# Patient Record
Sex: Female | Born: 1955 | Race: White | Hispanic: No | Marital: Single | Smoking: Never smoker
Health system: Southern US, Community
[De-identification: ages and names within clinical notes are randomized; demographics above are authoritative.]

## PROBLEM LIST (undated history)

## (undated) DIAGNOSIS — R51 Headache: Secondary | ICD-10-CM

## (undated) DIAGNOSIS — Z862 Personal history of diseases of the blood and blood-forming organs and certain disorders involving the immune mechanism: Secondary | ICD-10-CM

## (undated) DIAGNOSIS — K2970 Gastritis, unspecified, without bleeding: Secondary | ICD-10-CM

## (undated) DIAGNOSIS — D259 Leiomyoma of uterus, unspecified: Secondary | ICD-10-CM

## (undated) DIAGNOSIS — M810 Age-related osteoporosis without current pathological fracture: Secondary | ICD-10-CM

## (undated) HISTORY — DX: Personal history of diseases of the blood and blood-forming organs and certain disorders involving the immune mechanism: Z86.2

## (undated) HISTORY — DX: Leiomyoma of uterus, unspecified: D25.9

## (undated) HISTORY — DX: Gastritis, unspecified, without bleeding: K29.70

## (undated) HISTORY — PX: APPENDECTOMY: SHX54

## (undated) HISTORY — DX: Age-related osteoporosis without current pathological fracture: M81.0

## (undated) HISTORY — PX: WISDOM TOOTH EXTRACTION: SHX21

---
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
## 1997-09-11 ENCOUNTER — Other Ambulatory Visit: Admission: RE | Admit: 1997-09-11 | Discharge: 1997-09-11 | Payer: Self-pay | Admitting: *Deleted

## 1998-07-27 ENCOUNTER — Other Ambulatory Visit: Admission: RE | Admit: 1998-07-27 | Discharge: 1998-07-27 | Payer: Self-pay | Admitting: *Deleted

## 1998-07-31 ENCOUNTER — Encounter (INDEPENDENT_AMBULATORY_CARE_PROVIDER_SITE_OTHER): Payer: Self-pay | Admitting: Specialist

## 1998-07-31 ENCOUNTER — Other Ambulatory Visit: Admission: RE | Admit: 1998-07-31 | Discharge: 1998-07-31 | Payer: Self-pay | Admitting: *Deleted

## 1999-06-03 ENCOUNTER — Other Ambulatory Visit: Admission: RE | Admit: 1999-06-03 | Discharge: 1999-06-03 | Payer: Self-pay | Admitting: *Deleted

## 2001-12-04 ENCOUNTER — Other Ambulatory Visit: Admission: RE | Admit: 2001-12-04 | Discharge: 2001-12-04 | Payer: Self-pay | Admitting: Obstetrics and Gynecology

## 2003-04-21 ENCOUNTER — Other Ambulatory Visit: Admission: RE | Admit: 2003-04-21 | Discharge: 2003-04-21 | Payer: Self-pay | Admitting: Obstetrics and Gynecology

## 2004-12-10 ENCOUNTER — Emergency Department (HOSPITAL_COMMUNITY): Admission: EM | Admit: 2004-12-10 | Discharge: 2004-12-10 | Payer: Self-pay | Admitting: Family Medicine

## 2005-09-28 ENCOUNTER — Other Ambulatory Visit: Admission: RE | Admit: 2005-09-28 | Discharge: 2005-09-28 | Payer: Self-pay | Admitting: Obstetrics & Gynecology

## 2005-12-08 ENCOUNTER — Ambulatory Visit (HOSPITAL_COMMUNITY): Admission: RE | Admit: 2005-12-08 | Discharge: 2005-12-08 | Payer: Self-pay | Admitting: Gastroenterology

## 2006-07-02 HISTORY — PX: LAPAROSCOPIC UNILATERAL SALPINGECTOMY: SHX5934

## 2006-07-11 ENCOUNTER — Encounter: Payer: Self-pay | Admitting: Obstetrics & Gynecology

## 2006-07-11 ENCOUNTER — Ambulatory Visit (HOSPITAL_COMMUNITY): Admission: RE | Admit: 2006-07-11 | Discharge: 2006-07-11 | Payer: Self-pay | Admitting: Obstetrics & Gynecology

## 2006-09-15 ENCOUNTER — Other Ambulatory Visit: Admission: RE | Admit: 2006-09-15 | Discharge: 2006-09-15 | Payer: Self-pay | Admitting: Obstetrics & Gynecology

## 2007-09-19 ENCOUNTER — Other Ambulatory Visit: Admission: RE | Admit: 2007-09-19 | Discharge: 2007-09-19 | Payer: Self-pay | Admitting: Obstetrics & Gynecology

## 2010-06-15 NOTE — Op Note (Signed)
NAME:  Rhonda Shaffer, Rhonda Shaffer             ACCOUNT NO.:  [id number]   MEDICAL RECORD NO.:  [id number]          PATIENT TYPE:  AMB   LOCATION:  SDC                           FACILITY:  [facility]   PHYSICIAN:  M. Leda Quail, MD  DATE OF BIRTH:  1955-08-12   DATE OF PROCEDURE:  07/11/2006  DATE OF DISCHARGE:                               OPERATIVE REPORT   PREOPERATIVE DIAGNOSES:  94. 55 year old white female with enlarging right hydrosalpinx.  2. History of endometriosis.  3. History of uterine suspension  4. History of ovarian cancer in her sister.   POSTOPERATIVE DIAGNOSES:  74. 67 year old white female with enlarging right hydrosalpinx.  2. History of endometriosis.  3. History of uterine suspension  4. History of ovarian cancer in her sister.   PROCEDURE:  Laparoscopic left salpingectomy.   SURGEON:  Dr. Hyacinth Meeker   ASSISTANT:  Edwena Felty. Romine, M.D.   ANESTHESIA:  General endotracheal.   SPECIMENS:  Left tube to pathology.   ESTIMATED BLOOD LOSS:  Minimal.   URINE OUTPUT:  50 mL.   FLUIDS:  800 mL of LR.   COMPLICATIONS:  None.   INDICATIONS:  The patient is a very nice 55 year old white female who  has a history of an enlarging left hydrosalpinx. This was initially  discovered on ultrasound.  The patient underwent this because of some  abdominal discomfort and recent diagnosis of ovarian cancer in her  sister.  We have followed this conservatively.  However, this is  enlarging.  We discussed continued monitoring versus surgical excision  and she has opted to go ahead and have removed surgically.  She is here  for this today.  Risks and benefits have explained and informed consent  is present on the chart.   PROCEDURE:  The patient is taken to the operating room.  She is placed  in supine position.  General endotracheal anesthesia is administered by  the anesthesia staff without difficulty.  Abdomen, perineum, inner  thighs and vagina prepped in normal sterile fashion.   Bivalve speculum  was placed in the vagina.  The anterior lip of the cervix was grasped  with single-tooth tenaculum.  Hulka clamp was passed through the  cervical os and attached to the cervix as a means of manipulating the  uterus during the procedure.  The tenaculum was removed and speculum was  removed from vagina.  Foley catheter is placed to straight drain.  The  patient is draped in normal sterile fashion.   Attention is turned to the umbilicus.  5 mL of half percent Marcaine  were instilled beneath umbilicus.  A 10 mm skin incision was made  inferiorly from the belly button.  Subcutaneous fat and tissue were  dissected with a hemostat.  The fascia is identified and grasped with  Kocher clamps.  It is elevated and the fascia is incised sharply with  Mayo scissors.  The S retractors were placed through this incision.  The  peritoneum was identified, elevated, and incised sharply.  S retractors  were placed through this.  A nonbladed Hasson and port were placed  through the incision after  the fascia was stitched on each side with a  single 2-0 Vicryl.  The Vicryls attached to the [place].  The port is  removed.  The laparoscope was placed through the port to ensure  intraperitoneal placement and then the pneumoperitoneum is achieved with  CO2 gas.  With the pneumoperitoneum was achieved the pelvis was  surveyed.  The patient did have some dense adhesions of the fundus of  the uterus to the anterior abdominal wall.  She had a uterine prolapse  that was repaired when she was about 55 years old. My guess is she had a  retroflexed uterus that was attached to the anterior abdominal wall.  The right ovary and fallopian tube well visualized and appear normal.  The left fallopian tube is enlarged and appears to have blood in it and  has dark appearance.  The left ovary looked normal.  There is no actual  evidence of endometriosis.  The patient does have some areas on the  peritoneum  consistent with scarring so I am in agreement with her past  history of endometriosis.  The upper abdomen surveyed as well.  No  abnormalities were noted.  Photo documentation was made.   Attention is then turned to the inferior right and left quadrants.  The  abdominal wall area was transilluminated to note the location of the  vasculature.  Placement sites for the right and left lower quadrant  ports are identified.  These are approximately 2 to 2-1/2 cm above the  pubic symphysis and lateral to the inferior epigastric vessel.  The 5 mm  skin incisions are made with the blade.  Then bladed trocars and ports  are placed under direct visualization of the laparoscope through the  abdominal wall layer.  The trocars are removed.  An endoscopic grasper  and blunt probe were obtained.  The bowel is scooped out of the pelvis  and the patient is placed in Trendelenburg.  The left side is well  visualized and decision was made to obtain a gyrus.  Then using the  gyrus the fallopian tube is cut off of the ovary, the mesosalpinx was  grasped and gyrus closed. Cautery with sound indicator was used and then  the blade is used to dissect the tissue plane between the ovary and the  fallopian tube.  This was carried up all way to the cornua and then the  cornu was transected with a gyrus as well by a clamping, cauterizing and  cutting the tissue.  Once the fallopian tube was freed from the left  ovary, then a grasper was brought through the operative port on the  laparoscope. This hydrosalpinx was grasped and brought through the port  intact.  It was handed off to pathology.  Then a long irrigation tip  with 60 mL of normal saline was attached to it, was passed through the  right lower quadrant port.  The left area of the dissection was  irrigated.  No bleeding was noted.  The irrigant was emptied out of the  pelvis.  At this point the patient was placed back in supine position  and Trendelenburg was  released.  The right and left lower quadrant ports  removed under direct visualization and no bleeding was noted.  The  pneumoperitoneum was released and the laparoscope was removed.   Anesthesia gave the patient several good breaths and the operator's  assistant pressed on the abdomen to help the gas escape from the  abdomen.  The [employer]  was then removed from the midline and the previous  fascial stitches were tied together to bring the fascia together in the  midline.  The infraumbilical incision was then closed with a 4-0 Vicryl  with a subcuticular stitch.  The skin was closed at all three sites with  Dermabond.  The patient's legs were positioned back in supine position  and the Betadine wash was cleansed off the skin.  The patient was  awakened from anesthesia and extubated without difficulty.   Sponge, lap, needle and instrument counts were correct x2.  The patient  was in stable condition and taken to recovery room at this point.      Lum Keas, MD  Electronically Signed     MSM/MEDQ  D:  07/11/2006  T:  07/11/2006  Job:  [id number]

## 2010-06-18 NOTE — Op Note (Signed)
NAME:  Rhonda Shaffer, Rhonda Shaffer             ACCOUNT NO.:  [id number]   MEDICAL RECORD NO.:  [id number]          PATIENT TYPE:  AMB   LOCATION:  ENDO                         FACILITY:  [facility]   PHYSICIAN:  Anselmo Rod, M.D.  DATE OF BIRTH:  Feb 25, 1955   DATE OF PROCEDURE:  12/09/2005  DATE OF DISCHARGE:                                 OPERATIVE REPORT   PROCEDURE PERFORMED:  Screening colonoscopy.   ENDOSCOPIST:  Anselmo Rod, M.D.   INSTRUMENT USED:  Olympus video colonoscope.   INDICATION FOR PROCEDURE:  This 55 year old white female underwent a  screening colonoscopy to rule out colonic polyps, masses, etc.   PREPROCEDURE PREPARATION:  Informed consent was procured from the patient.  The patient was fasted for 4 hours prior to the procedure and prepped with  20 Osmo Prep pills the night of and 12 Osmo Prep pills the morning of the  procedure.  Risks and benefits of the procedure including a 10% miss rate of  cancer and polyp were discussed with the patient as well.   PREPROCEDURE PHYSICAL:  Patient had stable vital signs.  NECK:  Supple.  CHEST:  Clear to auscultation.  S1, S2 regular.  ABDOMEN:  Soft with normal bowel sounds.   DESCRIPTION OF THE PROCEDURE:  The patient was placed in the left lateral  decubitus position, sedated with 75 mcg of fentanyl and 8 mg of Versed given  intravenously in slow incremental doses.  Once the patient was adequately  sedated and maintained on low flow oxygen and continuous cardiac monitoring,  the pediatric adjustable colonoscope was advanced from the rectum to the  cecum.  Multiple washings were done.  The appendiceal orifice and ileocecal  valve were clearly visualized and photographed.  The terminal ileum appeared  healthy and without lesions.  There were no masses, polyps, erosions,  ulceration or diverticula seen.  Retroflexion of the rectum revealed no  abnormalities.  The patient tolerated the procedure well without immediate  complications.   IMPRESSION:  Normal colonoscopy of the terminal ileum.  No masses, polyps or  diverticula seen.   RECOMMENDATIONS:  1. Continue on high fiber diet with liberal fluid intake.  2. Repeat colonoscopy in the next 10 years unless the patient develops any      abnormal symptoms in the interim.  3. Outpatient follow up as need arises in the future.      Anselmo Rod, M.D.  Electronically Signed     JNM/MEDQ  D:  12/09/2005  T:  12/09/2005  Job:  [id number]   cc:   M. Leda Quail, MD

## 2010-11-18 LAB — URINALYSIS, ROUTINE W REFLEX MICROSCOPIC
Glucose, UA: NEGATIVE
Ketones, ur: NEGATIVE
Protein, ur: NEGATIVE

## 2010-11-18 LAB — BASIC METABOLIC PANEL
GFR calc non Af Amer: >60
Glucose, Bld: 92
Potassium: 3.6
Sodium: 140

## 2010-11-18 LAB — CBC
MCHC: 33.5
RBC: 3.92
WBC: 7.3

## 2010-11-18 LAB — URINE MICROSCOPIC-ADD ON

## 2012-06-13 ENCOUNTER — Telehealth: Payer: Self-pay | Admitting: Obstetrics & Gynecology

## 2012-06-13 NOTE — Telephone Encounter (Signed)
Spoke with pt who was trying to make a MMG appt, and wanted a 3D MMG done because she had a family member who has dense breasts tell her to request this type from now on. She was told she needed a doctor's permission to have 3D done. Pt wondering if Dr. Hyacinth Meeker would order this for her.

## 2012-06-13 NOTE — Telephone Encounter (Signed)
I will order it for her.  Can you bring me her chart?

## 2012-06-13 NOTE — Telephone Encounter (Signed)
Patient called for requests from doctor Hyacinth Meeker for 3D screening for breast cancer.

## 2012-06-14 NOTE — Telephone Encounter (Signed)
She can just schedule this.  I double checked with [employer].  She can even do it on-line.

## 2012-06-14 NOTE — Telephone Encounter (Signed)
LM for pt that she can schedule MMG herself. SM checked with [employer]. Pt can schedule online. Pt to call back if she has problems.

## 2012-10-25 ENCOUNTER — Encounter: Payer: Self-pay | Admitting: Obstetrics & Gynecology

## 2013-02-21 ENCOUNTER — Encounter: Payer: Self-pay | Admitting: Obstetrics & Gynecology

## 2013-02-22 ENCOUNTER — Encounter: Payer: Self-pay | Admitting: Obstetrics & Gynecology

## 2013-02-22 ENCOUNTER — Ambulatory Visit (INDEPENDENT_AMBULATORY_CARE_PROVIDER_SITE_OTHER): Payer: BC Managed Care – PPO | Admitting: Obstetrics & Gynecology

## 2013-02-22 VITALS — BP 128/72 | HR 60 | Resp 16 | Ht 62.0 in | Wt 130.6 lb

## 2013-02-22 DIAGNOSIS — Z0000 Encounter for general adult medical examination without abnormal findings: Secondary | ICD-10-CM

## 2013-02-22 DIAGNOSIS — Z8041 Family history of malignant neoplasm of ovary: Secondary | ICD-10-CM

## 2013-02-22 DIAGNOSIS — Z01419 Encounter for gynecological examination (general) (routine) without abnormal findings: Secondary | ICD-10-CM

## 2013-02-22 LAB — POCT URINALYSIS DIPSTICK
BILIRUBIN UA: NEGATIVE
Glucose, UA: NEGATIVE
Ketones, UA: NEGATIVE
Leukocytes, UA: NEGATIVE
NITRITE UA: NEGATIVE
PH UA: 5
Protein, UA: NEGATIVE
RBC UA: NEGATIVE
UROBILINOGEN UA: NEGATIVE

## 2013-02-22 LAB — COMPREHENSIVE METABOLIC PANEL
ALBUMIN: 4.4 g/dL (ref 3.5–5.2)
ALT: 21 U/L (ref 0–35)
AST: 23 U/L (ref 0–37)
Alkaline Phosphatase: 78 U/L (ref 39–117)
BILIRUBIN TOTAL: 0.9 mg/dL (ref 0.3–1.2)
BUN: 15 mg/dL (ref 6–23)
CO2: 32 meq/L (ref 19–32)
Calcium: 9.9 mg/dL (ref 8.4–10.5)
Chloride: 102 mEq/L (ref 96–112)
Creat: 0.67 mg/dL (ref 0.50–1.10)
GLUCOSE: 115 mg/dL — AB (ref 70–99)
POTASSIUM: 4.7 meq/L (ref 3.5–5.3)
SODIUM: 140 meq/L (ref 135–145)
TOTAL PROTEIN: 7.1 g/dL (ref 6.0–8.3)

## 2013-02-22 LAB — TSH: TSH: 1.995 u[IU]/mL (ref 0.350–4.500)

## 2013-02-22 LAB — HEMOGLOBIN, FINGERSTICK: HEMOGLOBIN, FINGERSTICK: 13.6 g/dL (ref 12.0–16.0)

## 2013-02-22 LAB — LIPID PANEL
CHOLESTEROL: 193 mg/dL (ref 0–200)
HDL: 70 mg/dL (ref >39)
LDL Cholesterol: 87 mg/dL (ref 0–99)
Total CHOL/HDL Ratio: 2.8 Ratio
Triglycerides: 181 mg/dL — ABNORMAL HIGH (ref <150)
VLDL: 36 mg/dL (ref 0–40)
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
## 2013-02-22 NOTE — Patient Instructions (Signed)

## 2013-02-23 LAB — VITAMIN D 25 HYDROXY (VIT D DEFICIENCY, FRACTURES): VIT D 25 HYDROXY: 39 ng/mL (ref 30–89)

## 2013-02-23 LAB — CA 125: CA 125: 20.7 U/mL (ref 0.0–30.2)

## 2013-02-26 ENCOUNTER — Telehealth: Payer: Self-pay | Admitting: Obstetrics & Gynecology

## 2013-02-26 NOTE — Telephone Encounter (Signed)
Voicemail confirmed patient identity/Left message for patient to call back to discuss benefits for upcoming surgery/ssf

## 2013-02-27 NOTE — Telephone Encounter (Signed)
I telephoned the patient. At the patients request I left a detailed voicemail message concerning her insurance coverage for the physicians portion of her surgery charges. I advised that patient liability for the physicians charges were quoted as  $1282.49 ($750 ded + $532.49 coins). I explained that part is due to her $750 calendar year deductible not being met and the remaining portion is due to her plan paying at 70% and leaving 30% patient liability. I advised that Gay Filler would be contacting her to schedule the surgery and that once the surgery is scheduled, this patient amount $1282.49, would need to be paid in full at least 2 weeks prior to her surgery date. Left office # for the patient to call if she has further questions and/or concerns.

## 2013-02-27 NOTE — Telephone Encounter (Signed)
Pt is returning a call to [place]. Pt states it is ok for you to leave a detailed message with dates and time.

## 2013-02-28 ENCOUNTER — Telehealth: Payer: Self-pay

## 2013-02-28 NOTE — Telephone Encounter (Signed)
Lmtcb//kn 

## 2013-02-28 NOTE — Telephone Encounter (Signed)
Message copied by Robley Fries on Thu Feb 28, 2013  1:42 PM ------      Message from: Megan Salon      Created: Mon Feb 25, 2013  1:08 PM       Inform ca-125 normal.   Lipids fine--triglycerides are a little up but test wasn't fasting.  Vit D fine.  TSH nl.  CMP with mildly elevated glucose but, again, test wasn't fasting. ------

## 2013-02-28 NOTE — Telephone Encounter (Signed)
Patient notified

## 2013-03-04 ENCOUNTER — Telehealth: Payer: Self-pay | Admitting: Orthopedic Surgery

## 2013-03-04 NOTE — Telephone Encounter (Signed)
LMTCB concerning surgery instructions.

## 2013-04-01 ENCOUNTER — Encounter (HOSPITAL_COMMUNITY): Payer: Self-pay

## 2013-04-01 ENCOUNTER — Encounter (HOSPITAL_COMMUNITY)
Admission: RE | Admit: 2013-04-01 | Discharge: 2013-04-01 | Disposition: A | Discharge status: Home / Self Care | Payer: BC Managed Care – PPO | Source: Ambulatory Visit | Attending: Obstetrics & Gynecology | Admitting: Obstetrics & Gynecology

## 2013-04-01 ENCOUNTER — Telehealth: Payer: Self-pay | Admitting: *Deleted

## 2013-04-01 ENCOUNTER — Ambulatory Visit (INDEPENDENT_AMBULATORY_CARE_PROVIDER_SITE_OTHER): Payer: BC Managed Care – PPO | Admitting: Obstetrics & Gynecology

## 2013-04-01 VITALS — BP 116/78 | HR 68 | Resp 16 | Ht 62.25 in | Wt 135.2 lb

## 2013-04-01 DIAGNOSIS — Z01812 Encounter for preprocedural laboratory examination: Secondary | ICD-10-CM | POA: Insufficient documentation

## 2013-04-01 DIAGNOSIS — N9489 Other specified conditions associated with female genital organs and menstrual cycle: Secondary | ICD-10-CM

## 2013-04-01 DIAGNOSIS — N736 Female pelvic peritoneal adhesions (postinfective): Secondary | ICD-10-CM

## 2013-04-01 HISTORY — DX: Headache: R51

## 2013-04-01 LAB — CBC
HCT: 41 % (ref 36.0–46.0)
Hemoglobin: 13.5 g/dL (ref 12.0–15.0)
MCH: 30.2 pg (ref 26.0–34.0)
MCHC: 32.9 g/dL (ref 30.0–36.0)
MCV: 91.7 fL (ref 78.0–100.0)
PLATELETS: 345 10*3/uL (ref 150–400)
RBC: 4.47 MIL/uL (ref 3.87–5.11)
RDW: 12.9 % (ref 11.5–15.5)
WBC: 8.6 10*3/uL (ref 4.0–10.5)

## 2013-04-01 MED ORDER — OXYCODONE-ACETAMINOPHEN 5-325 MG PO TABS: 2.0000 | ORAL_TABLET | ORAL | Status: DC | PRN

## 2013-04-01 MED ORDER — IBUPROFEN 800 MG PO TABS: 800.0000 mg | ORAL_TABLET | Freq: Three times a day (TID) | ORAL | Status: DC | PRN

## 2013-04-01 NOTE — Progress Notes (Signed)
Pelvic U/S scheduled and patient aware/agreeable to time.  Patient verbalized understanding of the U/S appointment cancellation policy. Advised will need to cancel within 72 business hours (3 business days) or will have $100.00 no show fee placed to account.  Scheduled for 04/11/13 at 1300.

## 2013-04-01 NOTE — Telephone Encounter (Signed)
Done when pt was in my office.  Thanks.
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
## 2013-04-01 NOTE — Patient Instructions (Addendum)
   Your procedure is scheduled on:  Monday, Mar 16  Enter through the [employer] [facility] at: 6 AM Pick up the phone at the desk and dial [phone] and inform us of your arrival.  Please call this number if you have any problems the morning of surgery: [phone]  Remember: Do not eat or drink after midnight: Sunday Take these medicines the morning of surgery with a SIP OF WATER:  None  Do not wear jewelry, make-up, or FINGER nail polish No metal in your hair or on your body. Do not wear lotions, powders, perfumes.  You may wear deodorant.  Do not bring valuables to the hospital. Contacts, dentures or bridgework may not be worn into surgery.  Leave suitcase in the car. After Surgery it may be brought to your room. For patients being admitted to the hospital, checkout time is 11:00am the day of discharge.  Home with cousin [facility] [phone] Cell [phone].

## 2013-04-01 NOTE — Telephone Encounter (Signed)
Patient's pre-op appt is today and they are requesting orders.

## 2013-04-02 MED ORDER — DEXTROSE 5 % IV SOLN: 2.0000 g | Freq: Once | INTRAVENOUS | Status: DC

## 2013-04-03 ENCOUNTER — Encounter: Payer: Self-pay | Admitting: Obstetrics & Gynecology

## 2013-04-11 ENCOUNTER — Ambulatory Visit (INDEPENDENT_AMBULATORY_CARE_PROVIDER_SITE_OTHER): Payer: BC Managed Care – PPO | Admitting: Obstetrics & Gynecology

## 2013-04-11 ENCOUNTER — Encounter: Payer: Self-pay | Admitting: Obstetrics & Gynecology

## 2013-04-11 ENCOUNTER — Ambulatory Visit (INDEPENDENT_AMBULATORY_CARE_PROVIDER_SITE_OTHER): Payer: BC Managed Care – PPO

## 2013-04-11 VITALS — BP 122/72 | Ht 62.25 in | Wt 137.0 lb

## 2013-04-11 DIAGNOSIS — N736 Female pelvic peritoneal adhesions (postinfective): Secondary | ICD-10-CM

## 2013-04-11 DIAGNOSIS — R102 Pelvic and perineal pain: Secondary | ICD-10-CM

## 2013-04-11 DIAGNOSIS — G8929 Other chronic pain: Secondary | ICD-10-CM

## 2013-04-11 DIAGNOSIS — N9489 Other specified conditions associated with female genital organs and menstrual cycle: Secondary | ICD-10-CM

## 2013-04-11 DIAGNOSIS — Z8041 Family history of malignant neoplasm of ovary: Secondary | ICD-10-CM

## 2013-04-11 DIAGNOSIS — N949 Unspecified condition associated with female genital organs and menstrual cycle: Secondary | ICD-10-CM

## 2013-04-11 NOTE — Progress Notes (Signed)
58 y.o. G0 Singlefemale here for a pelvic ultrasound.  Having hysterectomy and pt here to assess ovaries before surgery due to family hx of ovarian cancer in her sister.  Pt has a few more questions about surgery that were addressed today.  Patient's last menstrual period was 01/31/2010.  Sexually active:  yes  Contraception: PMP  FINDINGS: UTERUS: 6.5 x 4.0 x 2.5cm without fibroids EMS: 4.18mm ADNEXA:   Left ovary 1.6 x 0.8 x 0.8cm, no masses   Right ovary 2.2 x 0.9 x 1.1cm CUL DE SAC: no free fluid  Images reviewed.  All questions answered.  Pt getting a little nervous but ready to proceed  Assessment:  Chronic pelvic pain, family hx of ovarian cancer Plan: Proceed with surgery as planned.  ~15 minutes spent with patient >50% of time was in face to face discussion of above.

## 2013-04-14 NOTE — Anesthesia Preprocedure Evaluation (Addendum)
Anesthesia Evaluation  Patient identified by MRN, date of birth, ID band Patient awake    Reviewed: Allergy & Precautions, H&P , NPO status , Patient's Chart, lab work & pertinent test results  Airway Mallampati: II TM Distance: >3 FB Neck ROM: Full    Dental   Pulmonary neg pulmonary ROS,  breath sounds clear to auscultation        Cardiovascular negative cardio ROS  Rhythm:Regular Rate:Normal     Neuro/Psych  Headaches, negative psych ROS   GI/Hepatic negative GI ROS, Neg liver ROS,   Endo/Other  negative endocrine ROS  Renal/GU negative Renal ROS     Musculoskeletal negative musculoskeletal ROS (+)   Abdominal   Peds  Hematology  (+) anemia ,   Anesthesia Other Findings   Reproductive/Obstetrics negative OB ROS                           Anesthesia Physical Anesthesia Plan  ASA: II  Anesthesia Plan: General   Post-op Pain Management:    Induction: Intravenous  Airway Management Planned: Oral ETT  Additional Equipment:   Intra-op Plan:   Post-operative Plan: Extubation in OR  Informed Consent: I have reviewed the patients History and Physical, chart, labs and discussed the procedure including the risks, benefits and alternatives for the proposed anesthesia with the patient or authorized representative who has indicated his/her understanding and acceptance.   Dental advisory given  Plan Discussed with: CRNA  Anesthesia Plan Comments:         Anesthesia Quick Evaluation

## 2013-04-15 ENCOUNTER — Ambulatory Visit (HOSPITAL_COMMUNITY): Payer: BC Managed Care – PPO | Admitting: Anesthesiology

## 2013-04-15 ENCOUNTER — Ambulatory Visit (HOSPITAL_COMMUNITY)
Admission: RE | Admit: 2013-04-15 | Discharge: 2013-04-15 | Disposition: A | Discharge status: Home / Self Care | Payer: BC Managed Care – PPO | Source: Ambulatory Visit | Attending: Obstetrics & Gynecology | Admitting: Obstetrics & Gynecology

## 2013-04-15 ENCOUNTER — Encounter (HOSPITAL_COMMUNITY): Payer: Self-pay | Admitting: *Deleted

## 2013-04-15 ENCOUNTER — Encounter (HOSPITAL_COMMUNITY)
Admission: RE | Disposition: A | Discharge status: Home / Self Care | Payer: Self-pay | Source: Ambulatory Visit | Attending: Obstetrics & Gynecology

## 2013-04-15 ENCOUNTER — Encounter (HOSPITAL_COMMUNITY): Payer: BC Managed Care – PPO | Admitting: Anesthesiology

## 2013-04-15 DIAGNOSIS — R102 Pelvic and perineal pain: Secondary | ICD-10-CM | POA: Diagnosis present

## 2013-04-15 DIAGNOSIS — G8929 Other chronic pain: Secondary | ICD-10-CM | POA: Insufficient documentation

## 2013-04-15 DIAGNOSIS — N949 Unspecified condition associated with female genital organs and menstrual cycle: Secondary | ICD-10-CM | POA: Insufficient documentation

## 2013-04-15 DIAGNOSIS — N9489 Other specified conditions associated with female genital organs and menstrual cycle: Secondary | ICD-10-CM

## 2013-04-15 DIAGNOSIS — N736 Female pelvic peritoneal adhesions (postinfective): Secondary | ICD-10-CM

## 2013-04-15 DIAGNOSIS — Z8041 Family history of malignant neoplasm of ovary: Secondary | ICD-10-CM | POA: Insufficient documentation

## 2013-04-15 DIAGNOSIS — D649 Anemia, unspecified: Secondary | ICD-10-CM | POA: Insufficient documentation

## 2013-04-15 HISTORY — PX: CYSTOSCOPY: SHX5120

## 2013-04-15 HISTORY — PX: ROBOTIC ASSISTED TOTAL HYSTERECTOMY WITH BILATERAL SALPINGO OOPHERECTOMY: SHX6086

## 2013-04-15 LAB — HEMOGLOBIN: Hemoglobin: 12.6 g/dL (ref 12.0–15.0)

## 2013-04-15 SURGERY — ROBOTIC ASSISTED TOTAL HYSTERECTOMY WITH BILATERAL SALPINGO OOPHORECTOMY
Anesthesia: General | Site: Urethra

## 2013-04-15 MED ORDER — OXYCODONE-ACETAMINOPHEN 5-325 MG PO TABS
1.0000 | ORAL_TABLET | ORAL | Status: DC | PRN
  Administered 2013-04-15: 1 via ORAL
  Filled 2013-04-15: qty 1

## 2013-04-15 MED ORDER — DEXTROSE 5 % IV SOLN
2.0000 g | Freq: Once | INTRAVENOUS | Status: AC
  Administered 2013-04-15: 2 g via INTRAVENOUS
  Filled 2013-04-15: qty 2

## 2013-04-15 MED ORDER — ROCURONIUM BROMIDE 100 MG/10ML IV SOLN
INTRAVENOUS | Status: AC
  Filled 2013-04-15: qty 1

## 2013-04-15 MED ORDER — MEPERIDINE HCL 25 MG/ML IJ SOLN: 6.2500 mg | INTRAMUSCULAR | Status: DC | PRN

## 2013-04-15 MED ORDER — MORPHINE SULFATE 4 MG/ML IJ SOLN: 1.0000 mg | INTRAMUSCULAR | Status: DC | PRN

## 2013-04-15 MED ORDER — GLYCOPYRROLATE 0.2 MG/ML IJ SOLN
INTRAMUSCULAR | Status: AC
  Filled 2013-04-15: qty 1

## 2013-04-15 MED ORDER — ROCURONIUM BROMIDE 100 MG/10ML IV SOLN
INTRAVENOUS | Status: DC | PRN
  Administered 2013-04-15: 50 mg via INTRAVENOUS
  Administered 2013-04-15: 20 mg via INTRAVENOUS

## 2013-04-15 MED ORDER — MENTHOL 3 MG MT LOZG: 1.0000 | LOZENGE | OROMUCOSAL | Status: DC | PRN

## 2013-04-15 MED ORDER — LIDOCAINE HCL (CARDIAC) 20 MG/ML IV SOLN
INTRAVENOUS | Status: DC | PRN
  Administered 2013-04-15: 70 mg via INTRAVENOUS

## 2013-04-15 MED ORDER — DEXTROSE-NACL 5-0.45 % IV SOLN
INTRAVENOUS | Status: DC
  Administered 2013-04-15: 13:00:00 via INTRAVENOUS

## 2013-04-15 MED ORDER — SODIUM CHLORIDE 0.9 % IJ SOLN
INTRAMUSCULAR | Status: AC
  Filled 2013-04-15: qty 10

## 2013-04-15 MED ORDER — FENTANYL CITRATE 0.05 MG/ML IJ SOLN
INTRAMUSCULAR | Status: DC | PRN
  Administered 2013-04-15: 100 ug via INTRAVENOUS
  Administered 2013-04-15 (×3): 50 ug via INTRAVENOUS

## 2013-04-15 MED ORDER — OXYCODONE-ACETAMINOPHEN 5-325 MG PO TABS: 2.0000 | ORAL_TABLET | ORAL | Status: DC | PRN

## 2013-04-15 MED ORDER — KETOROLAC TROMETHAMINE 30 MG/ML IJ SOLN
30.0000 mg | Freq: Four times a day (QID) | INTRAMUSCULAR | Status: DC
  Administered 2013-04-15: 30 mg via INTRAVENOUS
  Filled 2013-04-15: qty 1

## 2013-04-15 MED ORDER — STERILE WATER FOR IRRIGATION IR SOLN
Status: DC | PRN
  Administered 2013-04-15: 1000 mL via INTRAVESICAL

## 2013-04-15 MED ORDER — HYDROMORPHONE HCL PF 1 MG/ML IJ SOLN
INTRAMUSCULAR | Status: DC | PRN
  Administered 2013-04-15: 1 mg via INTRAVENOUS

## 2013-04-15 MED ORDER — ALUM & MAG HYDROXIDE-SIMETH 200-200-20 MG/5ML PO SUSP: 30.0000 mL | ORAL | Status: DC | PRN

## 2013-04-15 MED ORDER — KETOROLAC TROMETHAMINE 30 MG/ML IJ SOLN: 30.0000 mg | Freq: Four times a day (QID) | INTRAMUSCULAR | Status: DC

## 2013-04-15 MED ORDER — ROPIVACAINE HCL 5 MG/ML IJ SOLN
INTRAMUSCULAR | Status: AC
  Filled 2013-04-15: qty 60

## 2013-04-15 MED ORDER — ATROPINE SULFATE 0.4 MG/ML IJ SOLN
INTRAMUSCULAR | Status: DC | PRN
  Administered 2013-04-15: 0.4 mg via INTRAVENOUS

## 2013-04-15 MED ORDER — HYDROMORPHONE HCL PF 1 MG/ML IJ SOLN
INTRAMUSCULAR | Status: AC
  Filled 2013-04-15: qty 1

## 2013-04-15 MED ORDER — ATROPINE SULFATE 0.4 MG/ML IJ SOLN
INTRAMUSCULAR | Status: AC
  Filled 2013-04-15: qty 1

## 2013-04-15 MED ORDER — ACETAMINOPHEN 325 MG PO TABS: 650.0000 mg | ORAL_TABLET | ORAL | Status: DC | PRN

## 2013-04-15 MED ORDER — FENTANYL CITRATE 0.05 MG/ML IJ SOLN
INTRAMUSCULAR | Status: AC
  Filled 2013-04-15: qty 5

## 2013-04-15 MED ORDER — LIDOCAINE HCL (CARDIAC) 20 MG/ML IV SOLN
INTRAVENOUS | Status: AC
  Filled 2013-04-15: qty 5

## 2013-04-15 MED ORDER — MIDAZOLAM HCL 2 MG/2ML IJ SOLN
INTRAMUSCULAR | Status: DC | PRN
  Administered 2013-04-15: 2 mg via INTRAVENOUS

## 2013-04-15 MED ORDER — ONDANSETRON HCL 4 MG/2ML IJ SOLN
4.0000 mg | Freq: Once | INTRAMUSCULAR | Status: AC | PRN
  Administered 2013-04-15: 4 mg via INTRAVENOUS
  Filled 2013-04-15: qty 2

## 2013-04-15 MED ORDER — MIDAZOLAM HCL 2 MG/2ML IJ SOLN
INTRAMUSCULAR | Status: AC
  Filled 2013-04-15: qty 2

## 2013-04-15 MED ORDER — PROPOFOL 10 MG/ML IV BOLUS
INTRAVENOUS | Status: DC | PRN
  Administered 2013-04-15: 170 mg via INTRAVENOUS

## 2013-04-15 MED ORDER — LACTATED RINGERS IV SOLN
INTRAVENOUS | Status: DC
  Administered 2013-04-15 (×2): via INTRAVENOUS

## 2013-04-15 MED ORDER — PANTOPRAZOLE SODIUM 40 MG IV SOLR
40.0000 mg | Freq: Every day | INTRAVENOUS | Status: DC
  Filled 2013-04-15: qty 40

## 2013-04-15 MED ORDER — DEXAMETHASONE SODIUM PHOSPHATE 10 MG/ML IJ SOLN
INTRAMUSCULAR | Status: DC | PRN
  Administered 2013-04-15: 10 mg via INTRAVENOUS

## 2013-04-15 MED ORDER — FENTANYL CITRATE 0.05 MG/ML IJ SOLN
25.0000 ug | INTRAMUSCULAR | Status: DC | PRN
  Administered 2013-04-15: 25 ug via INTRAVENOUS

## 2013-04-15 MED ORDER — SIMETHICONE 80 MG PO CHEW: 80.0000 mg | CHEWABLE_TABLET | Freq: Four times a day (QID) | ORAL | Status: DC | PRN

## 2013-04-15 MED ORDER — GLYCOPYRROLATE 0.2 MG/ML IJ SOLN
INTRAMUSCULAR | Status: DC | PRN
  Administered 2013-04-15: .4 mg via INTRAVENOUS
  Administered 2013-04-15: 0.2 mg via INTRAVENOUS

## 2013-04-15 MED ORDER — ONDANSETRON HCL 4 MG/2ML IJ SOLN
INTRAMUSCULAR | Status: AC
  Filled 2013-04-15: qty 2

## 2013-04-15 MED ORDER — PROPOFOL 10 MG/ML IV EMUL
INTRAVENOUS | Status: AC
  Filled 2013-04-15: qty 20

## 2013-04-15 MED ORDER — NEOSTIGMINE METHYLSULFATE 1 MG/ML IJ SOLN
INTRAMUSCULAR | Status: DC | PRN
  Administered 2013-04-15: 3 mg via INTRAVENOUS

## 2013-04-15 MED ORDER — KETOROLAC TROMETHAMINE 30 MG/ML IJ SOLN
INTRAMUSCULAR | Status: DC | PRN
  Administered 2013-04-15: 30 mg via INTRAVENOUS

## 2013-04-15 MED ORDER — NEOSTIGMINE METHYLSULFATE 1 MG/ML IJ SOLN
INTRAMUSCULAR | Status: AC
  Filled 2013-04-15: qty 1

## 2013-04-15 MED ORDER — GLYCOPYRROLATE 0.2 MG/ML IJ SOLN
INTRAMUSCULAR | Status: AC
  Filled 2013-04-15: qty 2

## 2013-04-15 MED ORDER — SODIUM CHLORIDE 0.9 % IJ SOLN
INTRAMUSCULAR | Status: AC
  Filled 2013-04-15: qty 50

## 2013-04-15 MED ORDER — ROPIVACAINE HCL 5 MG/ML IJ SOLN
INTRAMUSCULAR | Status: DC | PRN
  Administered 2013-04-15: 60 mL

## 2013-04-15 MED ORDER — ONDANSETRON HCL 4 MG/2ML IJ SOLN
INTRAMUSCULAR | Status: DC | PRN
  Administered 2013-04-15: 4 mg via INTRAVENOUS

## 2013-04-15 MED ORDER — FENTANYL CITRATE 0.05 MG/ML IJ SOLN
INTRAMUSCULAR | Status: AC
  Filled 2013-04-15: qty 2

## 2013-04-15 MED ORDER — LACTATED RINGERS IR SOLN
Status: DC | PRN
  Administered 2013-04-15: 3000 mL

## 2013-04-15 MED ORDER — DEXAMETHASONE SODIUM PHOSPHATE 10 MG/ML IJ SOLN
INTRAMUSCULAR | Status: AC
  Filled 2013-04-15: qty 1

## 2013-04-15 MED ORDER — KETOROLAC TROMETHAMINE 30 MG/ML IJ SOLN: 15.0000 mg | Freq: Once | INTRAMUSCULAR | Status: DC | PRN

## 2013-04-15 SURGICAL SUPPLY — 66 items
ADH SKN CLS APL DERMABOND .7 (GAUZE/BANDAGES/DRESSINGS) ×2
APL SKNCLS STERI-STRIP NONHPOA (GAUZE/BANDAGES/DRESSINGS) ×2
BAG URINE DRAINAGE (UROLOGICAL SUPPLIES) IMPLANT
BARRIER ADHS 3X4 INTERCEED (GAUZE/BANDAGES/DRESSINGS) ×3 IMPLANT
BENZOIN TINCTURE PRP APPL 2/3 (GAUZE/BANDAGES/DRESSINGS) ×3 IMPLANT
BRR ADH 4X3 ABS CNTRL BYND (GAUZE/BANDAGES/DRESSINGS) ×2
CHLORAPREP W/TINT 26ML (MISCELLANEOUS) ×3 IMPLANT
CLOTH BEACON ORANGE TIMEOUT ST (SAFETY) ×3 IMPLANT
CONT PATH 16OZ SNAP LID 3702 (MISCELLANEOUS) ×3 IMPLANT
COVER MAYO STAND STRL (DRAPES) ×3 IMPLANT
COVER TABLE BACK 60X90 (DRAPES) ×6 IMPLANT
COVER TIP SHEARS 8 DVNC (MISCELLANEOUS) ×2 IMPLANT
COVER TIP SHEARS 8MM DA VINCI (MISCELLANEOUS) ×1
DECANTER SPIKE VIAL GLASS SM (MISCELLANEOUS) ×3 IMPLANT
DERMABOND ADVANCED (GAUZE/BANDAGES/DRESSINGS) ×1
DERMABOND ADVANCED .7 DNX12 (GAUZE/BANDAGES/DRESSINGS) ×2 IMPLANT
DILATOR CANAL MILEX (MISCELLANEOUS) ×3 IMPLANT
DRAPE HUG U DISPOSABLE (DRAPE) ×3 IMPLANT
DRAPE LG THREE QUARTER DISP (DRAPES) ×6 IMPLANT
DRAPE WARM FLUID 44X44 (DRAPE) ×3 IMPLANT
ELECT REM PT RETURN 9FT ADLT (ELECTROSURGICAL) ×3
ELECTRODE REM PT RTRN 9FT ADLT (ELECTROSURGICAL) ×2 IMPLANT
EVACUATOR SMOKE 8.L (FILTER) ×3 IMPLANT
GAUZE VASELINE 3X9 (GAUZE/BANDAGES/DRESSINGS) IMPLANT
GLOVE BIOGEL PI IND STRL 7.0 (GLOVE) ×4 IMPLANT
GLOVE BIOGEL PI INDICATOR 7.0 (GLOVE) ×2
GLOVE ECLIPSE 6.5 STRL STRAW (GLOVE) ×12 IMPLANT
GOWN STRL REIN XL XLG (GOWN DISPOSABLE) ×18 IMPLANT
GYRUS RUMI II 2.5CM BLUE (DISPOSABLE) ×3
KIT ACCESSORY DA VINCI DISP (KITS) ×1
KIT ACCESSORY DVNC DISP (KITS) ×2 IMPLANT
LEGGING LITHOTOMY PAIR STRL (DRAPES) ×3 IMPLANT
NEEDLE INSUFFLATION 120MM (ENDOMECHANICALS) ×6 IMPLANT
OCCLUDER COLPOPNEUMO (BALLOONS) ×3 IMPLANT
PACK LAVH (CUSTOM PROCEDURE TRAY) ×3 IMPLANT
PAD PREP 24X48 CUFFED NSTRL (MISCELLANEOUS) ×6 IMPLANT
PLUG CATH AND CAP STER (CATHETERS) ×3 IMPLANT
PROTECTOR NERVE ULNAR (MISCELLANEOUS) ×6 IMPLANT
RUMI II GYRUS 2.5CM BLUE (DISPOSABLE) ×2 IMPLANT
SET CYSTO W/LG BORE CLAMP LF (SET/KITS/TRAYS/PACK) ×3 IMPLANT
SET IRRIG TUBING LAPAROSCOPIC (IRRIGATION / IRRIGATOR) ×3 IMPLANT
SOLUTION ELECTROLUBE (MISCELLANEOUS) ×3 IMPLANT
STRIP CLOSURE SKIN 1/4X4 (GAUZE/BANDAGES/DRESSINGS) ×3 IMPLANT
SUT VIC AB 0 CT1 27 (SUTURE) ×15
SUT VIC AB 0 CT1 27XBRD ANBCTR (SUTURE) ×10 IMPLANT
SUT VICRYL 0 UR6 27IN ABS (SUTURE) ×3 IMPLANT
SUT VICRYL 3 0 RAPIDE (SUTURE) ×3 IMPLANT
SUT VICRYL RAPIDE 4/0 PS 2 (SUTURE) ×12 IMPLANT
SUT VLOC 180 0 9IN  GS21 (SUTURE) ×2
SUT VLOC 180 0 9IN GS21 (SUTURE) ×4 IMPLANT
SYR 50ML LL SCALE MARK (SYRINGE) ×3 IMPLANT
SYSTEM CONVERTIBLE TROCAR (TROCAR) ×3 IMPLANT
TIP RUMI ORANGE 6.7MMX12CM (TIP) IMPLANT
TIP UTERINE 5.1X6CM LAV DISP (MISCELLANEOUS) ×3 IMPLANT
TIP UTERINE 6.7X10CM GRN DISP (MISCELLANEOUS) IMPLANT
TIP UTERINE 6.7X6CM WHT DISP (MISCELLANEOUS) IMPLANT
TIP UTERINE 6.7X8CM BLUE DISP (MISCELLANEOUS) ×3 IMPLANT
TOWEL OR 17X24 6PK STRL BLUE (TOWEL DISPOSABLE) ×6 IMPLANT
TRAY FOLEY CATH 14FR (SET/KITS/TRAYS/PACK) ×3 IMPLANT
TROCAR DILATING TIP 12MM 150MM (ENDOMECHANICALS) ×3 IMPLANT
TROCAR DISP BLADELESS 8 DVNC (TROCAR) ×2 IMPLANT
TROCAR DISP BLADELESS 8MM (TROCAR) ×1
TROCAR XCEL NON-BLD 5MMX100MML (ENDOMECHANICALS) ×3 IMPLANT
TUBING FILTER THERMOFLATOR (ELECTROSURGICAL) ×3 IMPLANT
WARMER LAPAROSCOPE (MISCELLANEOUS) ×3 IMPLANT
WATER STERILE IRR 1000ML POUR (IV SOLUTION) ×9 IMPLANT

## 2013-04-15 NOTE — Addendum Note (Signed)
Addendum created 04/15/13 1756 by Ignacia Bayley, CRNA   Modules edited: Notes Section   Notes Section:  File: [id number]

## 2013-04-15 NOTE — Anesthesia Postprocedure Evaluation (Signed)
  Anesthesia Post-op Note  Patient: Rhonda Shaffer  Procedure(s) Performed: Procedure(s): ROBOTIC ASSISTED TOTAL HYSTERECTOMY WITH  BILATERAL  OOPHORECTOMY AND  SALPINGECTOMY (Bilateral) CYSTOSCOPY (N/A)  Patient Location: [facility]  Anesthesia Type:General  Level of Consciousness: awake  Airway and Oxygen Therapy: Patient Spontanous Breathing  Post-op Pain: mild  Post-op Assessment: Patient's Cardiovascular Status Stable and Respiratory Function Stable  Post-op Vital Signs: stable  Complications: No apparent anesthesia complications

## 2013-04-15 NOTE — Discharge Instructions (Signed)

## 2013-04-15 NOTE — Progress Notes (Signed)
D/c pt/ a/ox3 denies pain pt. Walked out per her choicewith nt at her side

## 2013-04-15 NOTE — Anesthesia Postprocedure Evaluation (Signed)
Anesthesia Post Note  Patient: Rhonda Shaffer  Procedure(s) Performed: Procedure(s) (LRB): ROBOTIC ASSISTED TOTAL HYSTERECTOMY WITH  BILATERAL  OOPHORECTOMY AND  SALPINGECTOMY (Bilateral) CYSTOSCOPY (N/A)  Anesthesia type: General  Patient location: PACU  Post pain: Pain level controlled  Post assessment: Post-op Vital signs reviewed  Last Vitals:  Filed Vitals:   04/15/13 1041  BP: 123/65  Pulse: 59  Temp: 36.9 C  Resp: 16    Post vital signs: Reviewed  Level of consciousness: sedated  Complications: No apparent anesthesia complications
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
## 2013-04-15 NOTE — Discharge Summary (Signed)
Physician Discharge Summary  Patient ID: Rhonda Shaffer MRN: [id number] DOB/AGE: Apr 07, 1955 58 y.o.  Admit date: 04/15/2013 Discharge date: 04/15/2013  Admission Diagnoses: chronic pelvic pain, family hx of ovarian cancer  Discharge Diagnoses:  Active Problems:   * No active hospital problems. *   Discharged Condition: good  Hospital Course: Patient admitted through same day surgery.  She was taken to OR where robotic TLH/BSO, cystoscopy, LOA were performed.  Surgical findings included adhesions of round ligaments to anterior abdominal wall.  Surgery was uneventful.  EBL 25cc.  Foley catheter was removed before leaving OR.  Patient transferred to PACU where she was stable and then to 3rd floor for the remainder of her hospitalization.  During her post-op recovery, her vitals and stable and she was AF.  In evening of POD#0, she was able to transition to oral pain medications and regular diet.  She was able to ambulate and she had good pain control.  She was also able to void on her own.  Her exam was normal.  Post op hb was 12.6, decreased from 13.5, pre-operatively.  Pt met all criteria for discharge and desired to go home.   Consults: None  Significant Diagnostic Studies: labs: 12.6  Treatments: surgery: robotic TLH/BSO, cystoscopy  Discharge Exam: Blood pressure 108/57, pulse 71, temperature 98.5 F (36.9 C), temperature source Oral, resp. rate 18, height 5' 2"  (1.575 m), weight 131 lb (59.421 kg), last menstrual period 01/31/2010, SpO2 96.00%. General appearance: alert and cooperative Resp: clear to auscultation bilaterally Cardio: regular rate and rhythm, S1, S2 normal, no murmur, click, rub or gallop GI: soft, non-tender; bowel sounds normal; no masses,  no organomegaly Extremities: extremities normal, atraumatic, no cyanosis or edema Incision/Wound:clean, dry, intact  Disposition: Final discharge disposition not confirmed   Future Appointments Provider Department Dept Phone    04/22/2013 2:30 PM Lyman Speller, MD [place] [phone]   05/13/2013 2:45 PM Lyman Speller, MD [place] [phone]   03/07/2014 12:45 PM Lyman Speller, MD [facility] [phone]       Medication List         CENTRUM PO  Take 1 tablet by mouth daily.     ibuprofen 800 MG tablet  Commonly known as:  ADVIL,MOTRIN  Take 1 tablet (800 mg total) by mouth every 8 (eight) hours as needed.     oxyCODONE-acetaminophen 5-325 MG per tablet  Commonly known as:  PERCOCET  Take 2 tablets by mouth every 4 (four) hours as needed. use only as much as needed to relieve pain           Follow-up Information   Follow up with Lyman Speller, MD On 04/22/2013. (appt time 2:30pm)    Specialty:  Gynecology   Contact information:   [address] [place] [address] [phone]       Signed: Lyman Speller 04/15/2013, 7:49 PM

## 2013-04-15 NOTE — Op Note (Signed)
04/15/2013  10:36 AM  PATIENT:  Rhonda Shaffer  58 y.o. female G0 with hx of chronic pelvic pain starting after she had a probable uterine suspension around age 89.  Laparoscopy several years ago revealed pelvic adhesions due to suspension.  She considered a hysterectomy but sister was diagnosed with ovarian cancer in 2007.  Sister survived until 2011.  Pt has discussed this with me multiple times in the past but this year decided she was ready to proceed.  PRE-OPERATIVE DIAGNOSIS:  chronic pelvic pain, adhesions, family history ovarian cancer  POST-OPERATIVE DIAGNOSIS:  chronic pelvic pain. adhesions; family history, ovarian cancer  PROCEDURE:  Procedure(s): ROBOTIC ASSISTED TOTAL HYSTERECTOMY WITH  BILATERAL  OOPHORECTOMY AND  SALPINGECTOMY CYSTOSCOPY  SURGEON:  Yuritza Paulhus SUZANNE  ASSISTANTS: Silva, Brook   ANESTHESIA:   general  ESTIMATED BLOOD LOSS: 10cc  BLOOD ADMINISTERED:none   FLUIDS: 1300ccLr  UOP: 200cc clear uop  SPECIMEN:  Uterus, cervix, bilateral tubes and ovaries  DISPOSITION OF SPECIMEN:  PATHOLOGY  FINDINGS: thick, dense adhesions on the right side where the right round ligament was suspended.  This tilted the uterus to the right and anteriorly.  Adhesions present on the left side but the suspension appears to have failed on this side.  Partially absent left tube.  Small amount of omental adhesions in the RUQ  DESCRIPTION OF OPERATION: Patient is taken to the operating room. She is placed in the supine position. She has a running IV in place. Informed consent was present on the chart. SCDs on her lower extremities and functioning properly. General endotracheal anesthesia was administered by the anesthesia staff without difficulty. Dr. Arby BarretteHatchett oversaw case. Once adequate anesthesia was confirmed the legs are placed in the low lithotomy position in [place] stirrups. The patient was already on a beanbag. Her arms were tucked by the side. Air was removed from the  beanbag, allowing it to conform to the patient to ensure that there would be no movement during the Trendelenburg placement.  Chlor prep was then used to prep the abdomen and Betadine was used to prep the inner thighs, perineum and vagina. Once 3 minutes had past the patient was draped in a normal standard fashion. The legs were lifted to the high lithotomy position.  A heavy weighted speculum was too large for the vagina, so the cervix was visualized by placing  curved Deaver retractor posteriorly in the vagina.  The anterior lip of the cervix was grasped with single-tooth tenaculum. The uterus was obviously pulled anteriorly and deviated to the right.  A Milex dilator was used to locate the cervical os and dilate the cervix.  The uterus sounded to 8cm.  This was more than I expected based on pre-operative ultrasound so decision was made to place the laparoscopy and then directly visualize the uterus while vaginal instruments were placed.    Ropivacaine mixture (0.5% mixed one-to-one with normal saline) was used anesthetize the skin beneath the umbilicus.  A Veress needle was obtained.   The abdomen was elevated and the needle was passed directly into the abdomen. The peritoneum was felt as a pop as it was passed with the needle. A syringe of normal saline was attached the needle and aspiration was performed. No blood or fluid was noted. Fluid was injected without difficulty and a second aspiration was performed. No fluid or blood or saline was noted.  However, fluid did not drip easily into the needle.  This was attempted again with the same results.  Decision was made  to place a LUQ port.    Ropivacaine mixture was used to anesthetize the skin in the midclavicular line and 2 centimeters beneath the costal margin.  Using a 104mm optiview, non-bladed port with laparoscope attached, the port and scope were advanced under direct visualization until intraperitoneal placement was confirmed.  Pneumoperitoneum was  achieved with low flow CO2 gas.  Once 3 liters of gas was in the abdomen, the pelvic could easily be surveyed.  There was no uterine perforation present.  Next a 12 millimeter port bladed trocar was passed directly to the abdomen.  Locations for the 1 and #2 arm ports could also be visualized. The skin was transilluminated and the skin was anesthetized with ropivacaine mixture. 12mm skin incisions were made about 10 cm lateral to the umbilicus on each side. Then 90mm nondisposable trocar ports were passed directly into the abdomen. All trochars were removed.  The table was placed on the floor and the patient was placed in Trendelenburg.  Only enough Trendelenburg was used to get the bowels out of the pelvis.  This was not steep Trendelenburg.    Attention was turned back to the vagina.  A foley catheter was placed.  The cervix was dilated to a #21 Pratt dilator.  A 2.5 KOH ring was needed due to the cervix being small.  Although I could pass a #8 tip through the cervix, it did not hub the cervix.  Therefore a #6 tip was used.  The bulb on this could not be inflated, however, due to the patient's long cervix.  Also, it was difficult placing the RUMI uterine manipulator with the KOH ring in place due to the positioning of the cervix.  About an hour was spent trying to get adequate vaginal instrument placement.  A small episiotomy was placed to try and get a little more room vaginally.  Still I could not get the RUMI manipulator with the KOH ring attached placed to adequate satisfaction.  Decision was made to stop and just use the RUMI with tip only and no KOH ring and to proceed with as much laparoscopy as possible and hopefully be able to place the KOH ring later or complete the case vaginally.    Therefore, the robot was docked in a normal standard fashion to the left of the table. In the #1 arm was placed endoscopic scissors with monopolar cautery attached and then the #2 arm was placed [place] with bipolar  cautery attached. The assistant's port was the LUQ port.  Ropivacaine mixture (0.5% mixed one-to-one with normal saline), 60ccs was placed in the abdomen.  Ureters were noted in the pelvis and peristalsing on each side.  Attention was turned to the right side. With uterus on stretch the right round ligament was excised off the anterior abdominal wall.  tube was excised off the ovary and mesosalpinx was dissected to free the tube. Then the right IP ligament was clamped cauterized and incised. Next the right round ligament was serially clamped cauterized and incised. The anterior and posterior peritoneum of the inferior leaf of the broad ligament were opened. The anterior peritoneum was carried across past the midline and the beginning of the bladder flap dissection was initiated.  Without a KOH ring, it as hard to know how far down the cervix to go.  I stopped when I became uncomfortable with my location.  The right uterine artery was skeletonized.    Attention was turned the left side.  There was a partially missing fallopian  tube on this side.  The IP ligament was serially clamped, cauterized, and incised.    The left rough ligament was serially clamped cauterized and incised. The anterior and posterior peritoneum of the inferior leaf of the broad ligament were opened. The anterior peritoneum was carried across to the dissection on the right side. The remainder of the bladder flap was created using sharp dissection. Again, the bladder flap dissection was only carried as low as I felt comfortable due to not having the KOH ring as a landmark.  The left uterine artery was skeletonized as well.    Attention was then turned back to the vagina, after removing the laparoscopic instruments.  The patient was not taken out of Trendelenburg and the robot remained docked.  A curved Deaver was used to visualize the cervix.  At this point, the uterus was much more mobile and in more normal anatomic position.  The RUMI  uterine manipulator with the 2.5 KOH ring attached could be advanced into the uterus and there as a good fit around the cervix with the KOH ring.    At this point, the remainder of the bladder flap dissection could be completed.  The uterine arteries on each side of the uterus were clamped, cauterized, and incised above teh KOH ring.  The uterus was devascularized at this point.  Next, the colpotomy was performed a starting in the midline and using monopolar cautery with an open edge of the scissors. This was carried around a circumferential fashion until the vaginal mucosa was completely incised in the specimen was freed.  The specimen was then delivered to the vagina.  A vaginal occlusive device was used to maintain the pneumoperitoneum  Instruments were changed with a needle cut suture driver placed in arm 1 and a Cobra grasper placed #2. A V-lock suture was passed through the middle port. Starting in the right angle, the cuff was closed incorporating the anterior and posterior vaginal mucosa in each stitch. This was carried across all the way to the left corner and a running fashion. To stitches were brought back towards the midline and the suture was cut flush with the vagina. The needle was brought out of the pelvis. The pelvis was irrigated. All pedicles were inspected. No bleeding was noted.  At this point the laparoscopic portion of the procedure was completed. The instruments were removed. The robot was undocked. The patient was taken out of Trendelenburg positioning. The ports were removed under direct vision of the laparoscope and the pneumoperitoneum was relieved. Several deep breaths were given to the patient's trying to any gas out of the abdomen and finally the midline port was removed.  The midline port was closed at the fascial level with figure-of-eight suture of #0 Vicryl. The skin incisions were then then closed with subcuticular stitches of 3-0 Vicryl. The skin was cleansed Dermabond was  applied.   Attention was then turned the vagina and the cuff was inspected. No bleeding was noted.  The foley was removed.  a cystoscopy was performed.  The bladder mucosa was normal.  No stiches were noted.  There were no bladder injuries identified.  There ureters were functioning properly and good streams of urine were noted from the ureteral orifices bilaterally.  The cystoscopy was ended.  The scope removed.  The foley was placed back to straight drain.  Sponge, lap, needle, initially counts were correct x2. Patient tolerated the procedure very well. She was awakened from anesthesia, extubated and taken to recovery in  stable condition.   COUNTS:  YES  PLAN OF CARE: Transfer to PACU

## 2013-04-15 NOTE — Transfer of Care (Signed)
Immediate Anesthesia Transfer of Care Note  Patient: Rhonda Shaffer  Procedure(s) Performed: Procedure(s): ROBOTIC ASSISTED TOTAL HYSTERECTOMY WITH  BILATERAL  OOPHORECTOMY AND  SALPINGECTOMY (Bilateral) CYSTOSCOPY (N/A)  Patient Location: PACU  Anesthesia Type:General  Level of Consciousness: awake, alert  and oriented  Airway & Oxygen Therapy: Patient Spontanous Breathing and Patient connected to nasal cannula oxygen  Post-op Assessment: Report given to PACU RN and Post -op Vital signs reviewed and stable  Post vital signs: Reviewed and stable  Complications: No apparent anesthesia complications

## 2013-04-15 NOTE — Progress Notes (Signed)
Subjective: Patient reports no problems voiding.  Good pain control.  No nausea.  No trouble ambulating.  Eating well.  Desired discharge.  Objective: I have reviewed patient's vital signs, intake and output, medications and labs.  General: alert and cooperative Resp: clear to auscultation bilaterally Cardio: regular rate and rhythm, S1, S2 normal, no murmur, click, rub or gallop GI: soft, non-tender; bowel sounds normal; no masses,  no organomegaly and incision: clean, dry and intact Extremities: extremities normal, atraumatic, no cyanosis or edema Vaginal Bleeding: minimal   Assessment/Plan: Robotic assisted TLH/BSO, cysto, LOA.  Now meeting all criteria for discharge.    LOS: 0 days    Hale Bogus [facility] 04/15/2013, 7:43 PM

## 2013-04-17 ENCOUNTER — Encounter (HOSPITAL_COMMUNITY): Payer: Self-pay | Admitting: Obstetrics & Gynecology

## 2013-04-19 ENCOUNTER — Telehealth: Payer: Self-pay | Admitting: Obstetrics & Gynecology

## 2013-04-19 NOTE — Telephone Encounter (Signed)
Spoke with pt who will try OTC hydrocortisone cream. Pt will call if anything changes or if it gets worse.

## 2013-04-19 NOTE — Telephone Encounter (Signed)
Spoke with pt who had hysterectomy with bilat. oophorectomy and salpingectomy on Monday. Pt doing well overall except for some skin irritation on her abdomen she first noticed yesterday. There was a darker "blotchy" area that looked like a reaction to adhesive above her navel. Today she is noticing little specks like small acne on her abdomen, and she wonders if she is reacting to the skin cleanser they used before surgery. Area is confined to abdomen only.  No itch, burn, pain, fever, nausea. Pt has needed no percocet at all and has only taken motrin 2 days ago and 2 tylenol yesterday for pain. Had BM today. Pt has not taken anything else OTC. Pt just wanted to check with Dr. Sabra Heck. Please advise.

## 2013-04-19 NOTE — Telephone Encounter (Signed)
Patient had surgery Monday with Dr Sabra Heck and has a rash that started yesterday above her belly button but now there is little specks all over her stomach this morning. Is concerned and wants to talk to nurse.

## 2013-04-19 NOTE — Telephone Encounter (Signed)
Most likely related to skin prep.  Can use some topical hydrocortisone cream--OTC--if she wants for this.

## 2013-04-22 ENCOUNTER — Encounter: Payer: Self-pay | Admitting: Obstetrics & Gynecology

## 2013-04-22 ENCOUNTER — Ambulatory Visit (INDEPENDENT_AMBULATORY_CARE_PROVIDER_SITE_OTHER): Payer: BC Managed Care – PPO | Admitting: Obstetrics & Gynecology

## 2013-04-22 VITALS — BP 114/62 | HR 68 | Resp 16 | Ht 62.25 in | Wt 132.8 lb

## 2013-04-22 DIAGNOSIS — Z9889 Other specified postprocedural states: Secondary | ICD-10-CM

## 2013-04-22 NOTE — Progress Notes (Signed)
Post Operative Visit  Procedure:robotic assisted total hysterectomy with bilateral oophorectomy and salpingectomy Days Post-op: 8  Subjective: Doing well.  No vaginal bleeding.  Has only taken a little Motrin.  Voiding well.  Bowel movements normal.    Objective: BP 114/62  Pulse 68  Resp 16  Ht 5' 2.25" (1.581 m)  Wt 132 lb 12.8 oz (60.238 kg)  BMI 24.10 kg/m2  LMP 01/31/2010  EXAM General: alert and cooperative Resp: clear to auscultation bilaterally Cardio: regular rate and rhythm, S1, S2 normal, no murmur, click, rub or gallop GI: soft, non-tender; bowel sounds normal; no masses,  no organomegaly and incision: clean, dry and intact Extremities: extremities normal, atraumatic, no cyanosis or edema Vaginal Bleeding: none, vaginal cuff intact without erythema.  No masses  Assessment: s/p Robotic TLH/BSO, negative pathology (fibroids, adhesions present)  Plan: Recheck 3 weeks

## 2013-05-13 ENCOUNTER — Encounter: Payer: Self-pay | Admitting: Obstetrics & Gynecology

## 2013-05-13 ENCOUNTER — Ambulatory Visit (INDEPENDENT_AMBULATORY_CARE_PROVIDER_SITE_OTHER): Payer: BC Managed Care – PPO | Admitting: Obstetrics & Gynecology

## 2013-05-13 VITALS — BP 122/76 | HR 68 | Resp 16 | Ht 62.25 in | Wt 133.4 lb

## 2013-05-13 DIAGNOSIS — Z9889 Other specified postprocedural states: Secondary | ICD-10-CM

## 2013-05-13 NOTE — Progress Notes (Signed)
Post Operative Visit  Procedure:robotic assisted total hysterectomy/bilateral oophorectomy/salpin Days Post-op: 29  Subjective: Doing well.  Voiding well.  Bowel function normal for her--does have some IBS type symptoms but this is not new.  Still having a lot of fatigue.  Planning on going back April 27th.  Pt can have do a lot of lifting at work  Objective: BP 122/76  Pulse 68  Resp 16  Ht 5' 2.25" (1.581 m)  Wt 133 lb 6.4 oz (60.51 kg)  BMI 24.21 kg/m2  LMP 01/31/2010  EXAM General: alert and cooperative GI: soft, non-tender; bowel sounds normal; no masses,  no organomegaly and incision: clean, dry and intact Extremities: extremities normal, atraumatic, no cyanosis or edema Vaginal Bleeding: none Gyn:  Cuff healing well.  No fullness or masses.  Assessment: s/p TLH/BSO, cystoscopy  Plan: Recheck 6 weeks

## 2013-06-18 ENCOUNTER — Telehealth: Payer: Self-pay | Admitting: Obstetrics & Gynecology

## 2013-06-18 NOTE — Telephone Encounter (Signed)
Need to reschedule appointment can offer 06/26/13 at 12 or 4 per sally

## 2013-06-25 ENCOUNTER — Ambulatory Visit: Payer: BC Managed Care – PPO | Admitting: Obstetrics & Gynecology

## 2013-06-26 ENCOUNTER — Ambulatory Visit (INDEPENDENT_AMBULATORY_CARE_PROVIDER_SITE_OTHER): Payer: BC Managed Care – PPO | Admitting: Obstetrics & Gynecology

## 2013-06-26 ENCOUNTER — Encounter: Payer: Self-pay | Admitting: Obstetrics & Gynecology

## 2013-06-26 VITALS — BP 120/62 | HR 60 | Resp 12 | Ht 62.25 in | Wt 133.2 lb

## 2013-06-26 DIAGNOSIS — Z9889 Other specified postprocedural states: Secondary | ICD-10-CM

## 2013-06-26 NOTE — Progress Notes (Signed)
Patient ID: Rhonda Shaffer, female   DOB: 01-Sep-1955, 58 y.o.   MRN: [id number]  Post Operative Visit  Procedure: robotic assisted TLH/BSO, cysto, LOA Days Post-op: 11 weeks  Subjective: Doing well.  No VB.  Normal bladder and bowel function.  Pt working full time.  Needs a chair to sit on due to fatigue.  This is better.  Would like to continue to have chair.  Weight lifting restrictions will be lifted to 20# after 12 weeks post op.  Paperwork done for pt.   Objective: BP 120/62  Pulse 60  Resp 12  Ht 5' 2.25" (1.581 m)  Wt 133 lb 3.2 oz (60.419 kg)  BMI 24.17 kg/m2  LMP 01/31/2010  EXAM General: alert and cooperative GI: soft, non-tender; bowel sounds normal; no masses,  no organomegaly and incision: clean, dry and intact Extremities: extremities normal, atraumatic, no cyanosis or edema Vaginal Bleeding: none  Assessment: s/p robotic TLH/BSO, LOA, cysto  Plan: Recheck in October at [facility]

## 2014-03-07 ENCOUNTER — Ambulatory Visit: Payer: BC Managed Care – PPO | Admitting: Obstetrics & Gynecology

## 2014-10-09 ENCOUNTER — Ambulatory Visit (INDEPENDENT_AMBULATORY_CARE_PROVIDER_SITE_OTHER): Payer: BLUE CROSS/BLUE SHIELD | Admitting: Obstetrics & Gynecology

## 2014-10-09 ENCOUNTER — Encounter: Payer: Self-pay | Admitting: Obstetrics & Gynecology

## 2014-10-09 VITALS — BP 120/78 | HR 70 | Resp 14 | Ht 62.25 in | Wt 127.0 lb

## 2014-10-09 DIAGNOSIS — Z23 Encounter for immunization: Secondary | ICD-10-CM

## 2014-10-09 DIAGNOSIS — Z01419 Encounter for gynecological examination (general) (routine) without abnormal findings: Secondary | ICD-10-CM | POA: Diagnosis not present

## 2014-10-09 DIAGNOSIS — M8580 Other specified disorders of bone density and structure, unspecified site: Secondary | ICD-10-CM | POA: Diagnosis not present

## 2014-10-09 DIAGNOSIS — Z0000 Encounter for general adult medical examination without abnormal findings: Secondary | ICD-10-CM

## 2014-10-09 LAB — COMPREHENSIVE METABOLIC PANEL
ALBUMIN: 4.6 g/dL (ref 3.6–5.1)
ALK PHOS: 87 U/L (ref 33–130)
ALT: 14 U/L (ref 6–29)
AST: 20 U/L (ref 10–35)
BILIRUBIN TOTAL: 1.5 mg/dL — AB (ref 0.2–1.2)
BUN: 20 mg/dL (ref 7–25)
CALCIUM: 10 mg/dL (ref 8.6–10.4)
CO2: 29 mmol/L (ref 20–31)
Chloride: 103 mmol/L (ref 98–110)
Creat: 0.71 mg/dL (ref 0.50–1.05)
Glucose, Bld: 94 mg/dL (ref 65–99)
Potassium: 4.5 mmol/L (ref 3.5–5.3)
SODIUM: 141 mmol/L (ref 135–146)
TOTAL PROTEIN: 7.1 g/dL (ref 6.1–8.1)

## 2014-10-09 LAB — POCT URINALYSIS DIPSTICK
Bilirubin, UA: NEGATIVE
Glucose, UA: NEGATIVE
KETONES UA: NEGATIVE
Leukocytes, UA: NEGATIVE
Nitrite, UA: NEGATIVE
PH UA: 7
Protein, UA: NEGATIVE
RBC UA: NEGATIVE
UROBILINOGEN UA: NEGATIVE

## 2014-10-09 LAB — LIPID PANEL
CHOLESTEROL: 210 mg/dL — AB (ref 125–200)
HDL: 72 mg/dL (ref >=46)
LDL Cholesterol: 111 mg/dL (ref <130)
Total CHOL/HDL Ratio: 2.9 Ratio (ref <=5.0)
Triglycerides: 133 mg/dL (ref <150)
VLDL: 27 mg/dL (ref <30)

## 2014-10-09 LAB — TSH: TSH: 1.728 u[IU]/mL (ref 0.350–4.500)
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
## 2014-10-09 NOTE — Patient Instructions (Signed)
[employer] coh[employer]

## 2014-10-13 LAB — HEMOGLOBIN, FINGERSTICK: Hemoglobin, fingerstick: 13.8 g/dL (ref 12.0–16.0)

## 2014-10-14 ENCOUNTER — Telehealth: Payer: Self-pay

## 2014-10-14 NOTE — Telephone Encounter (Signed)
lmtcb to discuss lab results and BMD results. Hard copy on KN's desk//kn

## 2014-10-14 NOTE — Telephone Encounter (Signed)
-----   Message from Megan Salon, MD sent at 10/10/2014 10:41 AM EDT ----- Please inform pt cholesterol was a little elevated at 210.  LDL normal.  HDLs greast.  TGs normal.  Nothing needed for treatment.  CMP shows her bilirubin level was a little elevated.  Repeat one month.  Order placed.  Sorry she has to drive back here for that.  If has local lab she knows about, we can send order there for her to have done.  (I have not placed order yet.)  TSH normal.

## 2014-10-17 NOTE — Telephone Encounter (Signed)
Patient returned call and message from Dr. Sabra Heck given regarding lab results and Bone Density.  Patient has primary care, Dr. Gretchen Portela, at [facility] phone [phone].  She will request that her PCP recheck bilirubin.  Will fax records with request.

## 2014-10-20 NOTE — Telephone Encounter (Signed)
Faxed labs with fax confirmation received to Dr. Gretchen Portela.  Patient will follow up with pcp.

## 2014-10-28 ENCOUNTER — Encounter: Payer: Self-pay | Admitting: Obstetrics & Gynecology

## 2014-11-14 ENCOUNTER — Telehealth: Payer: Self-pay | Admitting: Obstetrics & Gynecology

## 2014-11-14 DIAGNOSIS — R899 Unspecified abnormal finding in specimens from other organs, systems and tissues: Secondary | ICD-10-CM

## 2014-11-14 NOTE — Telephone Encounter (Signed)
Patient is scheduled for lab appointment on 11/19/14 and need an order for a bilirubin test.

## 2014-11-14 NOTE — Telephone Encounter (Signed)
Dr. Sabra Heck,  Patient coming to our office for bilirubin recheck.  Does she need repeat CMP or just bilirubin level?

## 2014-11-17 NOTE — Telephone Encounter (Signed)
Order placed for liver function panel as a future order.  Ok to close encounter.

## 2014-11-19 ENCOUNTER — Other Ambulatory Visit: Payer: BLUE CROSS/BLUE SHIELD

## 2014-11-19 DIAGNOSIS — R899 Unspecified abnormal finding in specimens from other organs, systems and tissues: Secondary | ICD-10-CM

## 2014-11-19 LAB — HEPATIC FUNCTION PANEL
ALBUMIN: 4.4 g/dL (ref 3.6–5.1)
ALT: 13 U/L (ref 6–29)
AST: 17 U/L (ref 10–35)
Alkaline Phosphatase: 86 U/L (ref 33–130)
Bilirubin, Direct: 0.2 mg/dL (ref <=0.2)
Indirect Bilirubin: 1 mg/dL (ref 0.2–1.2)
TOTAL PROTEIN: 6.9 g/dL (ref 6.1–8.1)
Total Bilirubin: 1.2 mg/dL (ref 0.2–1.2)

## 2015-09-18 ENCOUNTER — Telehealth: Payer: Self-pay | Admitting: Obstetrics & Gynecology

## 2015-09-18 NOTE — Telephone Encounter (Signed)
Routing to Dr.Miller as FYI. Will close encounter. 

## 2015-09-18 NOTE — Telephone Encounter (Signed)
Patient called and requested an order for a breast lump to be sent to [facility]. Per Verline Lema, I scheduled for for an appointment on 09/28/15 with Dr. Sabra Heck. FYI only.

## 2015-09-22 ENCOUNTER — Ambulatory Visit (INDEPENDENT_AMBULATORY_CARE_PROVIDER_SITE_OTHER): Payer: BLUE CROSS/BLUE SHIELD | Admitting: Obstetrics & Gynecology

## 2015-09-22 ENCOUNTER — Encounter: Payer: Self-pay | Admitting: Obstetrics & Gynecology

## 2015-09-22 VITALS — BP 110/68 | HR 68 | Resp 14 | Ht 62.0 in | Wt 130.2 lb

## 2015-09-22 DIAGNOSIS — W57XXXA Bitten or stung by nonvenomous insect and other nonvenomous arthropods, initial encounter: Secondary | ICD-10-CM

## 2015-09-22 DIAGNOSIS — R5383 Other fatigue: Secondary | ICD-10-CM | POA: Diagnosis not present

## 2015-09-22 DIAGNOSIS — N63 Unspecified lump in breast: Secondary | ICD-10-CM | POA: Diagnosis not present

## 2015-09-22 DIAGNOSIS — N6310 Unspecified lump in the right breast, unspecified quadrant: Secondary | ICD-10-CM

## 2015-09-22 DIAGNOSIS — Z205 Contact with and (suspected) exposure to viral hepatitis: Secondary | ICD-10-CM | POA: Diagnosis not present

## 2015-09-22 DIAGNOSIS — T148 Other injury of unspecified body region: Secondary | ICD-10-CM
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
## 2015-09-23 LAB — LYME AB/WESTERN BLOT REFLEX

## 2015-09-23 LAB — TSH: TSH: 1.36 mIU/L

## 2015-09-23 LAB — HEPATITIS C ANTIBODY: HCV Ab: NEGATIVE

## 2015-09-23 LAB — VITAMIN D 25 HYDROXY (VIT D DEFICIENCY, FRACTURES): Vit D, 25-Hydroxy: 34 ng/mL (ref 30–100)

## 2015-11-24 ENCOUNTER — Telehealth: Payer: Self-pay

## 2015-11-24 NOTE — Telephone Encounter (Signed)
Spoke with patient. Patient states that her breast pain has resolved. Advised if breat pain returns she will need to contact the office. Patient is recommended to return to routine screening mammograms.  Routing to provider for final review. Patient agreeable to disposition. Will close encounter.

## 2015-12-07 ENCOUNTER — Encounter: Payer: Self-pay | Admitting: Obstetrics & Gynecology

## 2016-02-09 ENCOUNTER — Ambulatory Visit: Payer: BLUE CROSS/BLUE SHIELD | Admitting: Obstetrics & Gynecology

## 2017-10-04 ENCOUNTER — Encounter: Payer: Self-pay | Admitting: Obstetrics & Gynecology

## 2017-12-22 ENCOUNTER — Telehealth: Payer: Self-pay | Admitting: Obstetrics & Gynecology

## 2017-12-22 NOTE — Telephone Encounter (Signed)
Return call to patient. Left message to call back to triage nurse.

## 2017-12-22 NOTE — Telephone Encounter (Signed)
Patient recently had blood work done which shows signs of pre diabetes and she would like an appointment with Dr Sabra Heck. Last seen 09/22/2015.

## 2017-12-25 ENCOUNTER — Other Ambulatory Visit: Payer: Self-pay | Admitting: Gastroenterology

## 2017-12-25 DIAGNOSIS — R112 Nausea with vomiting, unspecified: Secondary | ICD-10-CM

## 2017-12-25 NOTE — Telephone Encounter (Addendum)
Patient states she had blood work drawn for her colonoscopy and was advised that her blood sugar was high and was to follow up with a primary care provider.  Patient lives in [place] and states she does not have a PCP.  Advised patient needs to establish care with a PCP.  Advised Dr. Sabra Heck can see her for well woman exam and can repeat blood work, if necessary, but would not manage diabetes if she were to need treatment.  Annual exam scheduled. Patient declined referral to PCP at this time.  She states she will continue to locate PCP near her home.  Routing to provider and will close encounter.
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
## 2018-01-22 ENCOUNTER — Encounter (HOSPITAL_COMMUNITY)
Admission: RE | Admit: 2018-01-22 | Discharge: 2018-01-22 | Disposition: A | Discharge status: Home / Self Care | Payer: BLUE CROSS/BLUE SHIELD | Source: Ambulatory Visit | Attending: Gastroenterology | Admitting: Gastroenterology

## 2018-01-22 ENCOUNTER — Ambulatory Visit: Payer: Self-pay | Admitting: Obstetrics & Gynecology

## 2018-01-22 ENCOUNTER — Telehealth: Payer: Self-pay

## 2018-01-22 DIAGNOSIS — R112 Nausea with vomiting, unspecified: Secondary | ICD-10-CM | POA: Diagnosis not present

## 2018-01-22 MED ORDER — TECHNETIUM TC 99M SULFUR COLLOID
2.1000 | Freq: Once | INTRAVENOUS | Status: AC | PRN
  Administered 2018-01-22: 2.1 via INTRAVENOUS

## 2018-01-22 MED ORDER — TECHNETIUM TC 99M SULFUR COLLOID: 2.1000 | Freq: Once | INTRAVENOUS | Status: DC | PRN

## 2018-01-22 NOTE — Telephone Encounter (Signed)
Tried calling patient to reschedule [facility] due to provider being delayed in surgery. No answer, left message for patient to call our office back.
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
## 2018-02-05 ENCOUNTER — Ambulatory Visit (INDEPENDENT_AMBULATORY_CARE_PROVIDER_SITE_OTHER): Payer: BLUE CROSS/BLUE SHIELD | Admitting: Obstetrics & Gynecology

## 2018-02-05 ENCOUNTER — Encounter: Payer: Self-pay | Admitting: Obstetrics & Gynecology

## 2018-02-05 VITALS — BP 132/74 | HR 66 | Resp 16 | Ht 62.0 in | Wt 127.6 lb

## 2018-02-05 DIAGNOSIS — Z01419 Encounter for gynecological examination (general) (routine) without abnormal findings: Secondary | ICD-10-CM

## 2018-02-05 DIAGNOSIS — Z0000 Encounter for general adult medical examination without abnormal findings: Secondary | ICD-10-CM

## 2018-02-05 NOTE — Patient Instructions (Signed)
Zoster Vaccine, Recombinant injection  What is this medicine?  ZOSTER VACCINE (ZOS ter vak SEEN) is used to prevent shingles in adults 63 years old and over. This vaccine is not used to treat shingles or nerve pain from shingles.  This medicine may be used for other purposes; ask your health care provider or pharmacist if you have questions.  COMMON BRAND NAME(S): SHINGRIX  What should I tell my health care provider before I take this medicine?  They need to know if you have any of these conditions:  -blood disorders or disease  -cancer like leukemia or lymphoma  -immune system problems or therapy  -an unusual or allergic reaction to vaccines, other medications, foods, dyes, or preservatives  -pregnant or trying to get pregnant  -breast-feeding  How should I use this medicine?  This vaccine is for injection in a muscle. It is given by a health care professional.  Talk to your pediatrician regarding the use of this medicine in children. This medicine is not approved for use in children.  Overdosage: If you think you have taken too much of this medicine contact a poison control center or emergency room at once.  NOTE: This medicine is only for you. Do not share this medicine with others.  What if I miss a dose?  Keep appointments for follow-up (booster) doses as directed. It is important not to miss your dose. Call your doctor or health care professional if you are unable to keep an appointment.  What may interact with this medicine?  -medicines that suppress your immune system  -medicines to treat cancer  -steroid medicines like prednisone or cortisone  This list may not describe all possible interactions. Give your health care provider a list of all the medicines, herbs, non-prescription drugs, or dietary supplements you use. Also tell them if you smoke, drink alcohol, or use illegal drugs. Some items may interact with your medicine.  What should I watch for while using this medicine?  Visit your doctor for regular  check ups.  This vaccine, like all vaccines, may not fully protect everyone.  What side effects may I notice from receiving this medicine?  Side effects that you should report to your doctor or health care professional as soon as possible:  -allergic reactions like skin rash, itching or hives, swelling of the face, lips, or tongue  -breathing problems  Side effects that usually do not require medical attention (report these to your doctor or health care professional if they continue or are bothersome):  -chills  -headache  -fever  -nausea, vomiting  -redness, warmth, pain, swelling or itching at site where injected  -tiredness  This list may not describe all possible side effects. Call your doctor for medical advice about side effects. You may report side effects to FDA at 1-800-FDA-1088.  Where should I keep my medicine?  This vaccine is only given in a clinic, pharmacy, doctor's office, or other health care setting and will not be stored at home.  NOTE: This sheet is a summary. It may not cover all possible information. If you have questions about this medicine, talk to your doctor, pharmacist, or health care provider.   2019 Elsevier/Gold Standard (2016-08-29 13:20:30)

## 2018-02-06 LAB — CBC
HEMATOCRIT: 39.9 % (ref 34.0–46.6)
Hemoglobin: 13.3 g/dL (ref 11.1–15.9)
MCH: 30.6 pg (ref 26.6–33.0)
MCHC: 33.3 g/dL (ref 31.5–35.7)
MCV: 92 fL (ref 79–97)
Platelets: 401 10*3/uL (ref 150–450)
RBC: 4.34 x10E6/uL (ref 3.77–5.28)
RDW: 12.5 % (ref 11.7–15.4)
WBC: 8.1 10*3/uL (ref 3.4–10.8)

## 2018-02-06 LAB — LIPID PANEL
Chol/HDL Ratio: 2.7 ratio (ref 0.0–4.4)
Cholesterol, Total: 184 mg/dL (ref 100–199)
HDL: 67 mg/dL (ref >39)
LDL CALC: 90 mg/dL (ref 0–99)
TRIGLYCERIDES: 136 mg/dL (ref 0–149)
VLDL Cholesterol Cal: 27 mg/dL (ref 5–40)

## 2018-02-06 LAB — TSH: TSH: 2.63 u[IU]/mL (ref 0.450–4.500)

## 2018-02-06 LAB — COMPREHENSIVE METABOLIC PANEL
A/G RATIO: 2 (ref 1.2–2.2)
ALBUMIN: 4.7 g/dL (ref 3.6–4.8)
ALK PHOS: 92 IU/L (ref 39–117)
ALT: 22 IU/L (ref 0–32)
AST: 19 IU/L (ref 0–40)
BILIRUBIN TOTAL: 0.7 mg/dL (ref 0.0–1.2)
BUN / CREAT RATIO: 15 (ref 12–28)
BUN: 11 mg/dL (ref 8–27)
CHLORIDE: 100 mmol/L (ref 96–106)
CO2: 26 mmol/L (ref 20–29)
Calcium: 10 mg/dL (ref 8.7–10.3)
Creatinine, Ser: 0.72 mg/dL (ref 0.57–1.00)
GFR calc Af Amer: 104 mL/min/{1.73_m2} (ref >59)
GFR calc non Af Amer: 90 mL/min/{1.73_m2} (ref >59)
GLOBULIN, TOTAL: 2.4 g/dL (ref 1.5–4.5)
Glucose: 99 mg/dL (ref 65–99)
POTASSIUM: 4.2 mmol/L (ref 3.5–5.2)
SODIUM: 141 mmol/L (ref 134–144)
Total Protein: 7.1 g/dL (ref 6.0–8.5)

## 2018-02-06 LAB — HEMOGLOBIN A1C
Est. average glucose Bld gHb Est-mCnc: 123 mg/dL
Hgb A1c MFr Bld: 5.9 % — ABNORMAL HIGH (ref 4.8–5.6)

## 2018-02-06 LAB — VITAMIN D 25 HYDROXY (VIT D DEFICIENCY, FRACTURES): Vit D, 25-Hydroxy: 23.3 ng/mL — ABNORMAL LOW (ref 30.0–100.0)

## 2018-10-01 ENCOUNTER — Encounter: Payer: Self-pay | Admitting: Obstetrics & Gynecology

## 2019-03-01 ENCOUNTER — Telehealth: Payer: Self-pay | Admitting: Obstetrics & Gynecology

## 2019-03-01 NOTE — Telephone Encounter (Signed)
Patient had an "issue at work" and is asking for a note from [place]. She is not sure if this is something Dr.Miller would be able to do and asked to talk with her nurse.

## 2019-03-01 NOTE — Telephone Encounter (Signed)
Spoke to pt. Pt states having trouble wearing her mask at work due to having an "anxiety " attack with having N/V.  Pt works at [employer] in [place]. Pt is stating she is having stress and pre-diabetes and would like a note saying having stress and having N/V while wearing her mask.  Pt states having no PCP. PCP recommendations given to pt at [place]. pt declines making appt at new PCP at this time.   Pt states her company will be sending our office for pt then fax back if completed to let her off work due to having anxiety/vomting.   Last AEX 02/05/2018, next AEX 06/06/2019.  Routing to Dr Sabra Heck for advice and recommendations.

## 2019-03-01 NOTE — Telephone Encounter (Signed)
I will review what comes from [place] but it may not be something I feel comfortable doing for her or we may need OV.  Can wait and see what paperwork comes.  Thanks.  Ok to close encounter.

## 2019-03-12 NOTE — Telephone Encounter (Signed)
Left message for pt to call back to triage RN.  

## 2019-03-12 NOTE — Telephone Encounter (Signed)
Patient returned a call to [place]. She said to disregard the previous conversation. She stated she is taking care of her mother and will discuss this issue at her next appointment with Dr.Miller. No need to return her call unless you have questions

## 2019-03-12 NOTE — Telephone Encounter (Signed)
Routing to Dr Sabra Heck for review and will close encounter.  Pt appt on 06/06/2019.
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
## 2019-06-05 ENCOUNTER — Other Ambulatory Visit: Payer: Self-pay

## 2019-06-06 ENCOUNTER — Ambulatory Visit: Payer: BC Managed Care – PPO | Admitting: Obstetrics & Gynecology

## 2019-06-06 ENCOUNTER — Other Ambulatory Visit: Payer: Self-pay

## 2019-06-06 ENCOUNTER — Encounter: Payer: Self-pay | Admitting: Obstetrics & Gynecology

## 2019-06-06 VITALS — BP 120/78 | HR 68 | Temp 98.1°F | Resp 16 | Ht 63.0 in | Wt 123.0 lb

## 2019-06-06 DIAGNOSIS — E559 Vitamin D deficiency, unspecified: Secondary | ICD-10-CM | POA: Diagnosis not present

## 2019-06-06 DIAGNOSIS — Z01419 Encounter for gynecological examination (general) (routine) without abnormal findings: Secondary | ICD-10-CM

## 2019-06-06 DIAGNOSIS — R7303 Prediabetes: Secondary | ICD-10-CM | POA: Diagnosis not present

## 2019-06-06 MED ORDER — TRIAMCINOLONE ACETONIDE 0.5 % EX OINT: TOPICAL_OINTMENT | CUTANEOUS | 0 refills | Status: DC

## 2019-06-06 NOTE — Addendum Note (Signed)
Addended by: Megan Salon on: 06/06/2019 09:49 AM   Modules accepted: Orders

## 2019-06-07 LAB — HEMOGLOBIN A1C
Est. average glucose Bld gHb Est-mCnc: 123 mg/dL
Hgb A1c MFr Bld: 5.9 % — ABNORMAL HIGH (ref 4.8–5.6)

## 2019-06-07 LAB — VITAMIN D 25 HYDROXY (VIT D DEFICIENCY, FRACTURES): Vit D, 25-Hydroxy: 28.6 ng/mL — ABNORMAL LOW (ref 30.0–100.0)

## 2019-10-10 ENCOUNTER — Telehealth: Payer: Self-pay

## 2019-10-10 NOTE — Telephone Encounter (Signed)
Left message for callback regarding bone density showing osteopenia. Repeat in about 15yrs.

## 2019-10-15 NOTE — Telephone Encounter (Signed)
Left message for call back.

## 2019-10-16 NOTE — Telephone Encounter (Signed)
Call to patient. Patient notified of BMD results as seen below. Advised patient per Dr. Sabra Heck would repeat in 3 years. Patient verbalized understanding and agreeable.   Encounter closed. BMD report to scan.

## 2019-10-16 NOTE — Telephone Encounter (Signed)
Patient is returning call.  °

## 2019-11-13 ENCOUNTER — Encounter: Payer: Self-pay | Admitting: Obstetrics & Gynecology

## 2020-08-13 ENCOUNTER — Ambulatory Visit: Payer: BC Managed Care – PPO

## 2021-03-29 ENCOUNTER — Ambulatory Visit (INDEPENDENT_AMBULATORY_CARE_PROVIDER_SITE_OTHER): Payer: Medicare Other | Admitting: Obstetrics & Gynecology

## 2021-03-29 ENCOUNTER — Encounter (HOSPITAL_BASED_OUTPATIENT_CLINIC_OR_DEPARTMENT_OTHER): Payer: Self-pay | Admitting: Obstetrics & Gynecology

## 2021-03-29 ENCOUNTER — Other Ambulatory Visit: Payer: Self-pay

## 2021-03-29 VITALS — BP 141/84 | HR 68 | Ht 61.5 in | Wt 125.6 lb

## 2021-03-29 DIAGNOSIS — Z01419 Encounter for gynecological examination (general) (routine) without abnormal findings: Secondary | ICD-10-CM | POA: Diagnosis not present

## 2021-03-29 DIAGNOSIS — M8580 Other specified disorders of bone density and structure, unspecified site: Secondary | ICD-10-CM

## 2021-03-29 DIAGNOSIS — L292 Pruritus vulvae: Secondary | ICD-10-CM | POA: Insufficient documentation

## 2021-03-29 DIAGNOSIS — Z8041 Family history of malignant neoplasm of ovary: Secondary | ICD-10-CM | POA: Diagnosis not present

## 2021-03-29 DIAGNOSIS — Z90710 Acquired absence of both cervix and uterus: Secondary | ICD-10-CM

## 2021-03-29 DIAGNOSIS — M8589 Other specified disorders of bone density and structure, multiple sites: Secondary | ICD-10-CM

## 2021-03-29 DIAGNOSIS — M810 Age-related osteoporosis without current pathological fracture: Secondary | ICD-10-CM | POA: Diagnosis not present

## 2021-03-29 MED ORDER — TRIAMCINOLONE ACETONIDE 0.5 % EX OINT: TOPICAL_OINTMENT | CUTANEOUS | 0 refills | Status: AC
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
## 2021-03-30 ENCOUNTER — Encounter (HOSPITAL_BASED_OUTPATIENT_CLINIC_OR_DEPARTMENT_OTHER): Payer: Self-pay | Admitting: Obstetrics & Gynecology

## 2021-11-24 ENCOUNTER — Encounter (HOSPITAL_BASED_OUTPATIENT_CLINIC_OR_DEPARTMENT_OTHER): Payer: Self-pay | Admitting: *Deleted

## 2021-11-24 DIAGNOSIS — M8589 Other specified disorders of bone density and structure, multiple sites: Secondary | ICD-10-CM

## 2021-11-24 DIAGNOSIS — M8580 Other specified disorders of bone density and structure, unspecified site: Secondary | ICD-10-CM

## 2022-12-01 ENCOUNTER — Encounter (HOSPITAL_BASED_OUTPATIENT_CLINIC_OR_DEPARTMENT_OTHER): Payer: Self-pay | Admitting: *Deleted

## 2023-04-13 ENCOUNTER — Ambulatory Visit (HOSPITAL_BASED_OUTPATIENT_CLINIC_OR_DEPARTMENT_OTHER): Payer: Medicare Other | Admitting: Obstetrics & Gynecology

## 2023-04-13 ENCOUNTER — Encounter (HOSPITAL_BASED_OUTPATIENT_CLINIC_OR_DEPARTMENT_OTHER): Payer: Self-pay | Admitting: Obstetrics & Gynecology

## 2023-04-13 VITALS — BP 131/66 | HR 63 | Ht 61.5 in | Wt 116.6 lb

## 2023-04-13 DIAGNOSIS — M810 Age-related osteoporosis without current pathological fracture: Secondary | ICD-10-CM

## 2023-04-13 DIAGNOSIS — Z01419 Encounter for gynecological examination (general) (routine) without abnormal findings: Secondary | ICD-10-CM

## 2023-04-13 DIAGNOSIS — Z8041 Family history of malignant neoplasm of ovary: Secondary | ICD-10-CM | POA: Diagnosis not present

## 2023-04-13 DIAGNOSIS — E7800 Pure hypercholesterolemia, unspecified: Secondary | ICD-10-CM

## 2023-04-13 DIAGNOSIS — E559 Vitamin D deficiency, unspecified: Secondary | ICD-10-CM | POA: Diagnosis not present

## 2023-04-13 DIAGNOSIS — R7303 Prediabetes: Secondary | ICD-10-CM

## 2023-04-13 DIAGNOSIS — Z90710 Acquired absence of both cervix and uterus: Secondary | ICD-10-CM

## 2023-04-13 NOTE — Progress Notes (Signed)
   ANNUAL EXAM Patient name: Rhonda Shaffer MRN [id number]  Date of birth: 1955/02/13 Chief Complaint:   Breast and Pelvic exam  History of Present Illness:   Rhonda Shaffer is a 68 y.o. G0P0000 Caucasian female being seen today for breast and pelvic exam.  Living with her mother right now who is 28.  Has been living with her for about three years to help with her care.    Patient's last menstrual period was 01/31/2010.   Last pap:   2015.  WNL.  Last mammogram: 11/21/2022. Results were: normal. Family h/o breast cancer: yes first cousin Last colonoscopy: 03/30/2018. Results were: normal. Family h/o colorectal cancer: no Dexa:  -2.7 hip     04/13/2023    2:10 PM 03/29/2021    2:35 PM  Depression screen PHQ 2/9  Decreased Interest 0 0  Down, Depressed, Hopeless 0 0  PHQ - 2 Score 0 0    Review of Systems:   Pertinent items are noted in HPI Denies any vagina bleeding, vaginal discharge, bowel changes, urinary issues, pelvic or abdominal pain  Pertinent History Reviewed:  Reviewed past medical,surgical, social and family history.   Reviewed problem list, medications and allergies. Physical Assessment:   Vitals:   04/13/23 1406  BP: 131/66  Pulse: 63  Weight: 116 lb 9.6 oz (52.9 kg)  Height: 5' 1.5" (1.562 m)  Body mass index is 21.67 kg/m.        Physical Examination:   General appearance - well appearing, and in no distress  Mental status - alert, oriented to person, place, and time  Psych:  She has a normal mood and affect  Skin - warm and dry, normal color, no suspicious lesions noted  Chest - effort normal, all lung fields clear to auscultation bilaterally  Heart - normal rate and regular rhythm  Neck:  midline trachea, no thyromegaly or nodules  Breasts - breasts appear normal, no suspicious masses, no skin or nipple changes or  axillary nodes  Abdomen - soft, nontender, nondistended, no masses or organomegaly  Pelvic - VULVA: normal appearing vulva with no  masses, tenderness or lesions   VAGINA: normal appearing vagina with normal color and discharge, no lesions   CERVIX: surgically absent  Thin prep pap is not indicated  UTERUS: surgically absent  ADNEXA: No adnexal masses or tenderness noted.  Rectal - normal rectal, good sphincter tone, no masses felt.  Extremities:  No swelling or varicosities noted  Chaperone present for exam, CMA, Ina Homes  Assessment & Plan:  1. Encntr for gyn exam (general) (routine) w/o abn findings (Primary) - Pap smear not indicated - Mammogram 11/2022 - Colonoscopy 2020.  Follow up 10 years.  - Bone mineral density 2023. - lab work done with PCP - vaccines reviewed/updated  2. Family history of ovarian cancer - s/p TLH/BSO  3. History of robot-assisted laparoscopic hysterectomy  4. Age-related osteoporosis without current pathological fracture - Comprehensive metabolic panel - she is taking 250 international unit of Vit D but 1000 I.U. recommended  5. Prediabetes - Hemoglobin A1c - TSH  6. Vitamin D deficiency - CBC with Differential/Platelet - Comprehensive metabolic panel - VITAMIN D 25 Hydroxy (Vit-D Deficiency, Fractures)  7. Elevated LDL cholesterol level - Lipid panel   Meds: No orders of the defined types were placed in this encounter.   Follow-up: Return for 1-2 years.  Jerene Bears, MD 04/13/2023 3:07 PM

## 2023-04-14 LAB — COMPREHENSIVE METABOLIC PANEL
ALT: 18 IU/L (ref 0–32)
AST: 20 IU/L (ref 0–40)
Albumin: 4.8 g/dL (ref 3.9–4.9)
Alkaline Phosphatase: 97 IU/L (ref 44–121)
BUN/Creatinine Ratio: 15 (ref 12–28)
BUN: 12 mg/dL (ref 8–27)
Bilirubin Total: 1.4 mg/dL — ABNORMAL HIGH (ref 0.0–1.2)
CO2: 25 mmol/L (ref 20–29)
Calcium: 9.9 mg/dL (ref 8.7–10.3)
Chloride: 100 mmol/L (ref 96–106)
Creatinine, Ser: 0.8 mg/dL (ref 0.57–1.00)
Globulin, Total: 2.6 g/dL (ref 1.5–4.5)
Glucose: 93 mg/dL (ref 70–99)
Potassium: 3.7 mmol/L (ref 3.5–5.2)
Sodium: 141 mmol/L (ref 134–144)
Total Protein: 7.4 g/dL (ref 6.0–8.5)
eGFR: 81 mL/min/{1.73_m2} (ref >59)

## 2023-04-14 LAB — CBC WITH DIFFERENTIAL/PLATELET
Basophils Absolute: 0.1 10*3/uL (ref 0.0–0.2)
Basos: 1 %
EOS (ABSOLUTE): 0.1 10*3/uL (ref 0.0–0.4)
Eos: 1 %
Hematocrit: 40.7 % (ref 34.0–46.6)
Hemoglobin: 13.8 g/dL (ref 11.1–15.9)
Immature Grans (Abs): 0 10*3/uL (ref 0.0–0.1)
Immature Granulocytes: 0 %
Lymphocytes Absolute: 2.6 10*3/uL (ref 0.7–3.1)
Lymphs: 30 %
MCH: 30.5 pg (ref 26.6–33.0)
MCHC: 33.9 g/dL (ref 31.5–35.7)
MCV: 90 fL (ref 79–97)
Monocytes Absolute: 0.4 10*3/uL (ref 0.1–0.9)
Monocytes: 4 %
Neutrophils Absolute: 5.5 10*3/uL (ref 1.4–7.0)
Neutrophils: 64 %
Platelets: 390 10*3/uL (ref 150–450)
RBC: 4.52 x10E6/uL (ref 3.77–5.28)
RDW: 12.3 % (ref 11.7–15.4)
WBC: 8.7 10*3/uL (ref 3.4–10.8)

## 2023-04-14 LAB — LIPID PANEL
Chol/HDL Ratio: 3.6 ratio (ref 0.0–4.4)
Cholesterol, Total: 255 mg/dL — ABNORMAL HIGH (ref 100–199)
HDL: 71 mg/dL (ref >39)
LDL Chol Calc (NIH): 157 mg/dL — ABNORMAL HIGH (ref 0–99)
Triglycerides: 150 mg/dL — ABNORMAL HIGH (ref 0–149)
VLDL Cholesterol Cal: 27 mg/dL (ref 5–40)

## 2023-04-14 LAB — HEMOGLOBIN A1C
Est. average glucose Bld gHb Est-mCnc: 126 mg/dL
Hgb A1c MFr Bld: 6 % — ABNORMAL HIGH (ref 4.8–5.6)

## 2023-04-14 LAB — VITAMIN D 25 HYDROXY (VIT D DEFICIENCY, FRACTURES): Vit D, 25-Hydroxy: 47.4 ng/mL (ref 30.0–100.0)

## 2023-04-14 LAB — TSH: TSH: 2.14 u[IU]/mL (ref 0.450–4.500)

## 2023-12-13 ENCOUNTER — Encounter (HOSPITAL_BASED_OUTPATIENT_CLINIC_OR_DEPARTMENT_OTHER): Payer: Self-pay

## 2024-04-16 ENCOUNTER — Ambulatory Visit (HOSPITAL_BASED_OUTPATIENT_CLINIC_OR_DEPARTMENT_OTHER): Admitting: Obstetrics & Gynecology
